# Patient Record
Sex: Male | Born: 1990 | Race: White | Hispanic: No | Marital: Married | Smoking: Former smoker
Health system: Southern US, Community
[De-identification: ages and names within clinical notes are randomized; demographics above are authoritative.]

## PROBLEM LIST (undated history)

## (undated) DIAGNOSIS — I10 Essential (primary) hypertension: Secondary | ICD-10-CM

---
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
## 2008-05-12 ENCOUNTER — Emergency Department (HOSPITAL_COMMUNITY): Admission: EM | Admit: 2008-05-12 | Discharge: 2008-05-12 | Payer: Self-pay | Admitting: Emergency Medicine

## 2010-06-10 LAB — RAPID STREP SCREEN (MED CTR MEBANE ONLY): Streptococcus, Group A Screen (Direct): NEGATIVE

## 2013-11-13 ENCOUNTER — Emergency Department (HOSPITAL_COMMUNITY): Payer: No Typology Code available for payment source

## 2013-11-13 ENCOUNTER — Encounter (HOSPITAL_COMMUNITY): Payer: Self-pay | Admitting: Emergency Medicine

## 2013-11-13 ENCOUNTER — Emergency Department (HOSPITAL_COMMUNITY)
Admission: EM | Admit: 2013-11-13 | Discharge: 2013-11-13 | Disposition: A | Discharge status: Home / Self Care | Payer: No Typology Code available for payment source | Attending: Emergency Medicine | Admitting: Emergency Medicine

## 2013-11-13 DIAGNOSIS — F17200 Nicotine dependence, unspecified, uncomplicated: Secondary | ICD-10-CM | POA: Insufficient documentation

## 2013-11-13 DIAGNOSIS — Y939 Activity, unspecified: Secondary | ICD-10-CM | POA: Insufficient documentation

## 2013-11-13 DIAGNOSIS — Y92410 Unspecified street and highway as the place of occurrence of the external cause: Secondary | ICD-10-CM | POA: Diagnosis not present

## 2013-11-13 DIAGNOSIS — IMO0002 Reserved for concepts with insufficient information to code with codable children: Secondary | ICD-10-CM | POA: Diagnosis present

## 2013-11-13 DIAGNOSIS — S139XXA Sprain of joints and ligaments of unspecified parts of neck, initial encounter: Secondary | ICD-10-CM | POA: Diagnosis not present

## 2013-11-13 DIAGNOSIS — S239XXA Sprain of unspecified parts of thorax, initial encounter: Secondary | ICD-10-CM | POA: Diagnosis not present

## 2013-11-13 DIAGNOSIS — T148XXA Other injury of unspecified body region, initial encounter: Secondary | ICD-10-CM

## 2013-11-13 MED ORDER — IBUPROFEN 800 MG PO TABS: 800.0000 mg | ORAL_TABLET | Freq: Three times a day (TID) | ORAL | Status: DC

## 2013-11-13 MED ORDER — OXYCODONE-ACETAMINOPHEN 5-325 MG PO TABS: 1.0000 | ORAL_TABLET | ORAL | Status: DC | PRN

## 2013-11-13 MED ORDER — CYCLOBENZAPRINE HCL 10 MG PO TABS: 10.0000 mg | ORAL_TABLET | Freq: Two times a day (BID) | ORAL | Status: DC | PRN

## 2013-11-13 MED ORDER — OXYCODONE-ACETAMINOPHEN 5-325 MG PO TABS
1.0000 | ORAL_TABLET | Freq: Once | ORAL | Status: AC
  Administered 2013-11-13: 1 via ORAL
  Filled 2013-11-13: qty 1
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
## 2013-11-13 NOTE — ED Provider Notes (Signed)
Medical screening examination/treatment/procedure(s) were performed by non-physician practitioner and as supervising physician I was immediately available for consultation/collaboration.   EKG Interpretation None       Nina Hoar, MD 11/13/13 1239 
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
## 2013-11-13 NOTE — ED Notes (Signed)
Patient transported to X-ray 

## 2013-11-21 ENCOUNTER — Encounter: Payer: Self-pay | Admitting: *Deleted

## 2014-01-13 ENCOUNTER — Ambulatory Visit: Payer: BC Managed Care – PPO | Admitting: Family Medicine
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
## 2017-05-08 ENCOUNTER — Emergency Department (HOSPITAL_COMMUNITY)
Admission: EM | Admit: 2017-05-08 | Discharge: 2017-05-08 | Disposition: A | Discharge status: Home / Self Care | Payer: 59 | Attending: Emergency Medicine | Admitting: Emergency Medicine

## 2017-05-08 ENCOUNTER — Encounter (HOSPITAL_COMMUNITY): Payer: Self-pay | Admitting: Emergency Medicine

## 2017-05-08 ENCOUNTER — Other Ambulatory Visit: Payer: Self-pay

## 2017-05-08 DIAGNOSIS — R55 Syncope and collapse: Secondary | ICD-10-CM | POA: Insufficient documentation

## 2017-05-08 DIAGNOSIS — Z5321 Procedure and treatment not carried out due to patient leaving prior to being seen by health care provider: Secondary | ICD-10-CM | POA: Diagnosis not present

## 2017-05-08 LAB — COMPREHENSIVE METABOLIC PANEL
ALT: 23 U/L (ref 17–63)
AST: 27 U/L (ref 15–41)
Albumin: 3.8 g/dL (ref 3.5–5.0)
Alkaline Phosphatase: 64 U/L (ref 38–126)
Anion gap: 8 (ref 5–15)
BUN: 14 mg/dL (ref 6–20)
CALCIUM: 9.2 mg/dL (ref 8.9–10.3)
CO2: 27 mmol/L (ref 22–32)
CREATININE: 0.98 mg/dL (ref 0.61–1.24)
Chloride: 105 mmol/L (ref 101–111)
GFR calc Af Amer: >60 mL/min (ref >60)
GFR calc non Af Amer: >60 mL/min (ref >60)
Glucose, Bld: 106 mg/dL — ABNORMAL HIGH (ref 65–99)
Potassium: 3.7 mmol/L (ref 3.5–5.1)
Sodium: 140 mmol/L (ref 135–145)
Total Bilirubin: 0.5 mg/dL (ref 0.3–1.2)
Total Protein: 7.2 g/dL (ref 6.5–8.1)

## 2017-05-08 LAB — CBC
HCT: 44.5 % (ref 39.0–52.0)
Hemoglobin: 14.5 g/dL (ref 13.0–17.0)
MCH: 28.2 pg (ref 26.0–34.0)
MCHC: 32.6 g/dL (ref 30.0–36.0)
MCV: 86.4 fL (ref 78.0–100.0)
Platelets: 245 10*3/uL (ref 150–400)
RBC: 5.15 MIL/uL (ref 4.22–5.81)
RDW: 13.4 % (ref 11.5–15.5)
WBC: 13.5 10*3/uL — ABNORMAL HIGH (ref 4.0–10.5)

## 2017-05-08 LAB — URINALYSIS, ROUTINE W REFLEX MICROSCOPIC
Bilirubin Urine: NEGATIVE
Glucose, UA: NEGATIVE mg/dL
Ketones, ur: NEGATIVE mg/dL
Leukocytes, UA: NEGATIVE
Nitrite: NEGATIVE
Protein, ur: 30 mg/dL — AB
Specific Gravity, Urine: 1.025 (ref 1.005–1.030)
pH: 6 (ref 5.0–8.0)

## 2017-05-08 LAB — RAPID URINE DRUG SCREEN, HOSP PERFORMED
Amphetamines: NOT DETECTED
BARBITURATES: NOT DETECTED
Benzodiazepines: NOT DETECTED
Cocaine: NOT DETECTED
Opiates: NOT DETECTED
Tetrahydrocannabinol: POSITIVE — AB

## 2017-05-08 NOTE — ED Notes (Signed)
Called no answer
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
## 2017-05-09 ENCOUNTER — Emergency Department (HOSPITAL_COMMUNITY): Payer: 59

## 2017-05-09 ENCOUNTER — Encounter (HOSPITAL_COMMUNITY): Payer: Self-pay | Admitting: Emergency Medicine

## 2017-05-09 ENCOUNTER — Emergency Department (HOSPITAL_COMMUNITY)
Admission: EM | Admit: 2017-05-09 | Discharge: 2017-05-09 | Disposition: A | Discharge status: Home / Self Care | Payer: 59 | Attending: Emergency Medicine | Admitting: Emergency Medicine

## 2017-05-09 DIAGNOSIS — W0110XA Fall on same level from slipping, tripping and stumbling with subsequent striking against unspecified object, initial encounter: Secondary | ICD-10-CM | POA: Insufficient documentation

## 2017-05-09 DIAGNOSIS — Y9389 Activity, other specified: Secondary | ICD-10-CM | POA: Diagnosis not present

## 2017-05-09 DIAGNOSIS — R69 Illness, unspecified: Secondary | ICD-10-CM

## 2017-05-09 DIAGNOSIS — Y999 Unspecified external cause status: Secondary | ICD-10-CM | POA: Diagnosis not present

## 2017-05-09 DIAGNOSIS — R55 Syncope and collapse: Secondary | ICD-10-CM

## 2017-05-09 DIAGNOSIS — S0990XA Unspecified injury of head, initial encounter: Secondary | ICD-10-CM

## 2017-05-09 DIAGNOSIS — J111 Influenza due to unidentified influenza virus with other respiratory manifestations: Secondary | ICD-10-CM

## 2017-05-09 DIAGNOSIS — Y92002 Bathroom of unspecified non-institutional (private) residence single-family (private) house as the place of occurrence of the external cause: Secondary | ICD-10-CM | POA: Insufficient documentation

## 2017-05-09 DIAGNOSIS — F17210 Nicotine dependence, cigarettes, uncomplicated: Secondary | ICD-10-CM | POA: Insufficient documentation

## 2017-05-09 DIAGNOSIS — I10 Essential (primary) hypertension: Secondary | ICD-10-CM | POA: Insufficient documentation

## 2017-05-09 LAB — CBC WITH DIFFERENTIAL/PLATELET
Basophils Absolute: 0 10*3/uL (ref 0.0–0.1)
Basophils Relative: 0 %
Eosinophils Absolute: 0.2 10*3/uL (ref 0.0–0.7)
Eosinophils Relative: 2 %
HCT: 41.6 % (ref 39.0–52.0)
Hemoglobin: 14 g/dL (ref 13.0–17.0)
Lymphocytes Relative: 13 %
Lymphs Abs: 0.9 10*3/uL (ref 0.7–4.0)
MCH: 28.8 pg (ref 26.0–34.0)
MCHC: 33.7 g/dL (ref 30.0–36.0)
MCV: 85.6 fL (ref 78.0–100.0)
Monocytes Absolute: 1.1 10*3/uL — ABNORMAL HIGH (ref 0.1–1.0)
Monocytes Relative: 15 %
Neutro Abs: 4.8 10*3/uL (ref 1.7–7.7)
Neutrophils Relative %: 70 %
Platelets: 218 10*3/uL (ref 150–400)
RBC: 4.86 MIL/uL (ref 4.22–5.81)
RDW: 13.4 % (ref 11.5–15.5)
WBC: 7 10*3/uL (ref 4.0–10.5)

## 2017-05-09 LAB — BASIC METABOLIC PANEL
Anion gap: 9 (ref 5–15)
BUN: 9 mg/dL (ref 6–20)
CO2: 26 mmol/L (ref 22–32)
Calcium: 8.8 mg/dL — ABNORMAL LOW (ref 8.9–10.3)
Chloride: 105 mmol/L (ref 101–111)
Creatinine, Ser: 1.08 mg/dL (ref 0.61–1.24)
GFR calc Af Amer: >60 mL/min (ref >60)
GFR calc non Af Amer: >60 mL/min (ref >60)
Glucose, Bld: 101 mg/dL — ABNORMAL HIGH (ref 65–99)
Potassium: 3.6 mmol/L (ref 3.5–5.1)
Sodium: 140 mmol/L (ref 135–145)

## 2017-05-09 MED ORDER — OSELTAMIVIR PHOSPHATE 75 MG PO CAPS: 75.0000 mg | ORAL_CAPSULE | Freq: Two times a day (BID) | ORAL | 0 refills | Status: DC
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
## 2017-05-09 NOTE — ED Notes (Signed)
Patient given discharge instructions and verbalized understanding.  Patient stable to discharge at this time.  Patient is alert and oriented to baseline.  No distressed noted at this time.  All belongings taken with the patient at discharge.   

## 2017-05-09 NOTE — Discharge Instructions (Signed)
Return to the ED with any concerns including recurrent fainting spells, chest pain, difficulty breathing, seizure activity, decreased level of alertness/lethargy, or any other alarming symptoms
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
## 2017-05-09 NOTE — ED Provider Notes (Signed)
[facility] EMERGENCY DEPARTMENT Provider Note   CSN: [id number] Arrival date & time: 05/09/17  1329     History   Chief Complaint Chief Complaint  Patient presents with  . Seizures    HPI Spencer Baker is a 27 y.o. male.  HPI  Patient presents after syncopal event yesterday.  He states he had a low-grade fever and had not been feeling well for the past couple of days.  He had just use the bathroom and was leaving the bathroom felt his heart racing and then fainted.  His wife witnessed the event states he went to open the bathroom door and fell backwards hitting his head and then falling to the ground.  He did lose consciousness.  Wife noted some shaking of his extremities briefly.  When he woke up he got immediately up.  He was not groggy or post ictal.  He denies any chest pain prior to fall, no severe headache.  Today he continues to have a low-grade fever with cough and congestion.  He did get his flu shot this year.  He has a sick contact with likely influenza.  There are no other associated systemic symptoms, there are no other alleviating or modifying factors.   History reviewed. No pertinent past medical history.  Patient Active Problem List   Diagnosis Date Noted  . OBESITY, NOS 04/27/2006  . ATTENTION DEFICIT, W/HYPERACTIVITY 04/27/2006  . HYPERTENSION, BENIGN SYSTEMIC 04/27/2006    History reviewed. No pertinent surgical history.     Home Medications    Prior to Admission medications   Medication Sig Start Date End Date Taking? Authorizing Provider  oseltamivir (TAMIFLU) 75 MG capsule Take 1 capsule (75 mg total) by mouth every 12 (twelve) hours. 05/09/17   Katherina Wimer, Latanya Maudlin, MD    Family History No family history on file.  Social History Social History   Tobacco Use  . Smoking status: Current Every Day Smoker    Packs/day: 0.50    Types: Cigarettes  . Smokeless tobacco: Never Used  Substance Use Topics  . Alcohol use: No  . Drug  use: No     Allergies   Patient has no known allergies.   Review of Systems Review of Systems  ROS reviewed and all otherwise negative except for mentioned in HPI   Physical Exam Updated Vital Signs BP (!) 160/102   Pulse 87   Temp (S) 99.7 F (37.6 C) (Oral)   Resp (!) 23   Ht 5\' 10"  (1.778 m)   Wt 124.7 kg (275 lb)   SpO2 98%   BMI 39.46 kg/m  Vitals reviewed Physical Exam  Physical Examination: General appearance - alert, well appearing, and in no distress Mental status - alert, oriented to person, place, and time Eyes - pupils equal and reactive, extraocular eye movements intact Mouth - mucous membranes moist, pharynx normal without lesions Neck - supple, no significant adenopathy, no midline neck pain Chest - clear to auscultation, no wheezes, rales or rhonchi, symmetric air entry Heart - normal rate, regular rhythm, normal S1, S2, no murmurs, rubs, clicks or gallops Abdomen - soft, nontender, nondistended, no masses or organomegaly Neurological - alert, oriented x 3, no cranial nerve deficit, strength 5/5 in extremities x 4, sensation intact, GCS 15 Extremities - peripheral pulses normal, no pedal edema, no clubbing or cyanosis Skin - normal coloration and turgor, no rashes   ED Treatments / Results  Labs (all labs ordered are listed, but only abnormal results are  displayed) Labs Reviewed  CBC WITH DIFFERENTIAL/PLATELET - Abnormal; Notable for the following components:      Result Value   Monocytes Absolute 1.1 (*)    All other components within normal limits  BASIC METABOLIC PANEL - Abnormal; Notable for the following components:   Glucose, Bld 101 (*)    Calcium 8.8 (*)    All other components within normal limits    EKG  EKG Interpretation  Date/Time:  Tuesday May 09 2017 14:32:05 EDT Ventricular Rate:  90 PR Interval:  136 QRS Duration: 96 QT Interval:  334 QTC Calculation: 408 R Axis:   72 Text Interpretation:  Normal sinus rhythm Normal  ECG No significant change since last tracing Confirmed by Jerelyn ScottLinker, Shacara Cozine [phone]([id number]) on 05/09/2017 6:09:26 PM       Radiology Ct Head Wo Contrast  Result Date: 05/09/2017 CLINICAL DATA:  Patient status post seizure yesterday. EXAM: CT HEAD WITHOUT CONTRAST TECHNIQUE: Contiguous axial images were obtained from the base of the skull through the vertex without intravenous contrast. COMPARISON:  None. FINDINGS: Brain: No evidence of acute infarction, hemorrhage, hydrocephalus, extra-axial collection or mass lesion/mass effect. Vascular: No hyperdense vessel or unexpected calcification. Skull: Intact. Sinuses/Orbits: Mucosal thickening is seen in the right sphenoid sinus. Other: None. IMPRESSION: Normal-appearing brain. Mucosal thickening right sphenoid sinus. Electronically Signed   By: Drusilla Kannerhomas  Dalessio M.D.   On: 05/09/2017 15:56    Procedures Procedures (including critical care time)  Medications Ordered in ED Medications - No data to display   Initial Impression / Assessment and Plan / ED Course  I have reviewed the triage vital signs and the nursing notes.  Pertinent labs & imaging results that were available during my care of the patient were reviewed by me and considered in my medical decision making (see chart for details).     Patient presenting after syncopal event yesterday during which he hit his head and had resultant brief shaking of his extremities.  Doubt this was a seizure as he was not postictal did not lose continence of bowel or bladder.  He did strike his head and head CT was obtained which was reassuring.  He also has fever cough body aches most consistent with influenza-like illness.  He is within the window for Tamiflu treatment for so prescription for Tamiflu was provided.  He has not anemic and EKG is reassuring.  Advised outpatient follow-up with primary care doctor.  Discharged with strict return precautions.  Pt agreeable with plan.  Final Clinical Impressions(s) / ED  Diagnoses   Final diagnoses:  Syncope, unspecified syncope type  Injury of head, initial encounter  Influenza-like illness    ED Discharge Orders        Ordered    oseltamivir (TAMIFLU) 75 MG capsule  Every 12 hours     05/09/17 1831       Phillis HaggisMabe, Ziona Wickens L, MD 05/09/17 1939

## 2017-05-09 NOTE — ED Notes (Signed)
MD at bedside. 

## 2018-09-12 ENCOUNTER — Other Ambulatory Visit: Payer: Self-pay

## 2018-09-12 ENCOUNTER — Encounter (HOSPITAL_COMMUNITY): Payer: Self-pay | Admitting: Emergency Medicine

## 2018-09-12 ENCOUNTER — Emergency Department (HOSPITAL_COMMUNITY)
Admission: EM | Admit: 2018-09-12 | Discharge: 2018-09-12 | Disposition: A | Discharge status: Home / Self Care | Payer: 59 | Attending: Emergency Medicine | Admitting: Emergency Medicine

## 2018-09-12 DIAGNOSIS — R197 Diarrhea, unspecified: Secondary | ICD-10-CM | POA: Insufficient documentation

## 2018-09-12 DIAGNOSIS — F17210 Nicotine dependence, cigarettes, uncomplicated: Secondary | ICD-10-CM | POA: Insufficient documentation

## 2018-09-12 DIAGNOSIS — R030 Elevated blood-pressure reading, without diagnosis of hypertension: Secondary | ICD-10-CM | POA: Diagnosis not present

## 2018-09-12 DIAGNOSIS — R112 Nausea with vomiting, unspecified: Secondary | ICD-10-CM | POA: Insufficient documentation

## 2018-09-12 LAB — COMPREHENSIVE METABOLIC PANEL
ALT: 32 U/L (ref 0–44)
AST: 23 U/L (ref 15–41)
Albumin: 4.2 g/dL (ref 3.5–5.0)
Alkaline Phosphatase: 63 U/L (ref 38–126)
Anion gap: 10 (ref 5–15)
BUN: 12 mg/dL (ref 6–20)
CO2: 27 mmol/L (ref 22–32)
Calcium: 9.5 mg/dL (ref 8.9–10.3)
Chloride: 104 mmol/L (ref 98–111)
Creatinine, Ser: 0.95 mg/dL (ref 0.61–1.24)
GFR calc Af Amer: >60 mL/min (ref >60)
GFR calc non Af Amer: >60 mL/min (ref >60)
Glucose, Bld: 110 mg/dL — ABNORMAL HIGH (ref 70–99)
Potassium: 4 mmol/L (ref 3.5–5.1)
Sodium: 141 mmol/L (ref 135–145)
Total Bilirubin: 0.5 mg/dL (ref 0.3–1.2)
Total Protein: 7.8 g/dL (ref 6.5–8.1)

## 2018-09-12 LAB — LIPASE, BLOOD: Lipase: 27 U/L (ref 11–51)

## 2018-09-12 LAB — CBC WITH DIFFERENTIAL/PLATELET
Abs Immature Granulocytes: 0.05 10*3/uL (ref 0.00–0.07)
Basophils Absolute: 0 10*3/uL (ref 0.0–0.1)
Basophils Relative: 0 %
Eosinophils Absolute: 0.2 10*3/uL (ref 0.0–0.5)
Eosinophils Relative: 1 %
HCT: 46.8 % (ref 39.0–52.0)
Hemoglobin: 15.3 g/dL (ref 13.0–17.0)
Immature Granulocytes: 0 %
Lymphocytes Relative: 8 %
Lymphs Abs: 1.2 10*3/uL (ref 0.7–4.0)
MCH: 28.5 pg (ref 26.0–34.0)
MCHC: 32.7 g/dL (ref 30.0–36.0)
MCV: 87.3 fL (ref 80.0–100.0)
Monocytes Absolute: 1.1 10*3/uL — ABNORMAL HIGH (ref 0.1–1.0)
Monocytes Relative: 8 %
Neutro Abs: 11.6 10*3/uL — ABNORMAL HIGH (ref 1.7–7.7)
Neutrophils Relative %: 83 %
Platelets: 283 10*3/uL (ref 150–400)
RBC: 5.36 MIL/uL (ref 4.22–5.81)
RDW: 13.3 % (ref 11.5–15.5)
WBC: 14.1 10*3/uL — ABNORMAL HIGH (ref 4.0–10.5)
nRBC: 0 % (ref 0.0–0.2)

## 2018-09-12 LAB — URINALYSIS, ROUTINE W REFLEX MICROSCOPIC
Bilirubin Urine: NEGATIVE
Glucose, UA: NEGATIVE mg/dL
Hgb urine dipstick: NEGATIVE
Ketones, ur: NEGATIVE mg/dL
Leukocytes,Ua: NEGATIVE
Nitrite: NEGATIVE
Protein, ur: NEGATIVE mg/dL
Specific Gravity, Urine: 1.028 (ref 1.005–1.030)
pH: 5 (ref 5.0–8.0)

## 2018-09-12 MED ORDER — DIPHENOXYLATE-ATROPINE 2.5-0.025 MG PO TABS
2.0000 | ORAL_TABLET | Freq: Once | ORAL | Status: AC
  Administered 2018-09-12: 2 via ORAL
  Filled 2018-09-12: qty 2

## 2018-09-12 MED ORDER — ONDANSETRON 4 MG PO TBDP
4.0000 mg | ORAL_TABLET | Freq: Once | ORAL | Status: AC
  Administered 2018-09-12: 4 mg via ORAL
  Filled 2018-09-12: qty 1

## 2018-09-12 MED ORDER — DIPHENOXYLATE-ATROPINE 2.5-0.025 MG PO TABS: 1.0000 | ORAL_TABLET | Freq: Four times a day (QID) | ORAL | 0 refills | Status: DC | PRN

## 2018-09-12 MED ORDER — ONDANSETRON 4 MG PO TBDP: 4.0000 mg | ORAL_TABLET | Freq: Three times a day (TID) | ORAL | 0 refills | Status: DC | PRN

## 2018-09-12 NOTE — ED Triage Notes (Signed)
C/o n/v/d since this am about 3 am.  Pt reports having lunch from work provides.  Lunch was [place] club.  Reports having about 10 loose and watery stools since 10 am.  Vomited about 4 times in last 24 hours.

## 2018-09-12 NOTE — ED Provider Notes (Signed)
[facility] Provider Note   CSN: [id number] Arrival date & time: 09/12/18  4098    History   Chief Complaint Chief Complaint  Patient presents with  . Diarrhea  . Emesis    HPI Spencer Baker is a 28 y.o. male with a history of obesity, attention deficit disorder and hypertension per his record, but patient denies history of this presenting with nausea vomiting and diarrhea which woke him around 3 AM today.  He reports eating a [place] sandwich yesterday around noon, meal was provided by his employer and other employees ate the same food as he, but about an hour after eating he developed some mild queasiness but had no other symptoms until he woke at 3 AM.  He reports 3-4 episodes of nonbloody emesis which has improved, but has had 10 or more episodes of diarrhea, now with passage of just clear water without blood.  He has had no fevers or chills, denies abdominal pain, simply reports cramping with improvement after a bowel movement.  He denies weakness or dizziness, also no chest pain, shortness of breath, headache.  He had a dose of Pepto-Bismol earlier this morning but he did not retain this medication.  No known exposures to others with similar symptoms.  No recent travel or antibiotic use.     The history is provided by the patient.    History reviewed. No pertinent past medical history.  Patient Active Problem List   Diagnosis Date Noted  . OBESITY, NOS 04/27/2006  . ATTENTION DEFICIT, W/HYPERACTIVITY 04/27/2006  . HYPERTENSION, BENIGN SYSTEMIC 04/27/2006    History reviewed. No pertinent surgical history.      Home Medications    Prior to Admission medications   Medication Sig Start Date End Date Taking? Authorizing Provider  diphenoxylate-atropine (LOMOTIL) 2.5-0.025 MG tablet Take 1 tablet by mouth 4 (four) times daily as needed for diarrhea or loose stools. 09/12/18   Evalee Jefferson, PA-C  ondansetron (ZOFRAN ODT) 4 MG disintegrating tablet  Take 1 tablet (4 mg total) by mouth every 8 (eight) hours as needed for nausea or vomiting. 09/12/18   Lamyiah Crawshaw, Almyra Free, PA-C  oseltamivir (TAMIFLU) 75 MG capsule Take 1 capsule (75 mg total) by mouth every 12 (twelve) hours. Patient not taking: Reported on 09/12/2018 05/09/17   Mabe, Forbes Cellar, MD    Family History History reviewed. No pertinent family history.  Social History Social History   Tobacco Use  . Smoking status: Current Every Day Smoker    Packs/day: 0.50    Types: Cigarettes  . Smokeless tobacco: Never Used  Substance Use Topics  . Alcohol use: No  . Drug use: No     Allergies   Patient has no known allergies.   Review of Systems Review of Systems  Constitutional: Negative for chills and fever.  HENT: Negative for congestion and sore throat.   Eyes: Negative.   Respiratory: Negative for chest tightness and shortness of breath.   Cardiovascular: Negative for chest pain.  Gastrointestinal: Positive for diarrhea, nausea and vomiting. Negative for abdominal pain.  Genitourinary: Negative.  Negative for difficulty urinating.  Musculoskeletal: Negative for arthralgias, joint swelling and neck pain.  Skin: Negative.  Negative for rash and wound.  Neurological: Negative for dizziness, weakness, light-headedness, numbness and headaches.  Psychiatric/Behavioral: Negative.      Physical Exam Updated Vital Signs BP (!) 157/107 (BP Location: Right Arm)   Pulse 65   Temp 98.2 F (36.8 C) (Oral)   Resp 18  Ht 5\' 10"  (1.778 m)   Wt 127 kg   SpO2 100%   BMI 40.18 kg/m   Physical Exam Vitals signs and nursing note reviewed.  Constitutional:      Appearance: He is well-developed. He is obese.  HENT:     Head: Normocephalic and atraumatic.     Mouth/Throat:     Mouth: Mucous membranes are moist.     Pharynx: Oropharynx is clear.  Neck:     Musculoskeletal: Normal range of motion.  Cardiovascular:     Rate and Rhythm: Normal rate and regular rhythm.     Heart  sounds: Normal heart sounds.     Comments: Hypertensive. Pulmonary:     Effort: Pulmonary effort is normal.     Breath sounds: Normal breath sounds. No wheezing.  Abdominal:     General: Bowel sounds are normal. There is no distension.     Palpations: Abdomen is soft.     Tenderness: There is no abdominal tenderness. There is no guarding or rebound.  Musculoskeletal: Normal range of motion.  Skin:    General: Skin is warm and dry.  Neurological:     Mental Status: He is alert.      ED Treatments / Results  Labs (all labs ordered are listed, but only abnormal results are displayed) Labs Reviewed  CBC WITH DIFFERENTIAL/PLATELET - Abnormal; Notable for the following components:      Result Value   WBC 14.1 (*)    Neutro Abs 11.6 (*)    Monocytes Absolute 1.1 (*)    All other components within normal limits  COMPREHENSIVE METABOLIC PANEL - Abnormal; Notable for the following components:   Glucose, Bld 110 (*)    All other components within normal limits  URINALYSIS, ROUTINE W REFLEX MICROSCOPIC - Abnormal; Notable for the following components:   APPearance HAZY (*)    All other components within normal limits  LIPASE, BLOOD    EKG None  Radiology No results found.  Procedures Procedures (including critical care time)  Medications Ordered in ED Medications  ondansetron (ZOFRAN-ODT) disintegrating tablet 4 mg (4 mg Oral Given 09/12/18 1026)  diphenoxylate-atropine (LOMOTIL) 2.5-0.025 MG per tablet 2 tablet (2 tablets Oral Given 09/12/18 1149)     Initial Impression / Assessment and Plan / ED Course  I have reviewed the triage vital signs and the nursing notes.  Pertinent labs & imaging results that were available during my care of the patient were reviewed by me and considered in my medical decision making (see chart for details).        Patient with a probable viral gastroenteritis.  He had no symptoms while here, he was given IV fluids, Zofran and Lomotil.  He  tolerated p.o. fluids prior to discharge home.  Home instructions given, return precautions discussed.  Discussed blood pressure elevation and need for recheck.  He has no symptoms of endorgan damage, denies headache, vision changes, chest pain or shortness of breath.  Final Clinical Impressions(s) / ED Diagnoses   Final diagnoses:  Nausea vomiting and diarrhea  Elevated blood pressure reading    ED Discharge Orders         Ordered    ondansetron (ZOFRAN ODT) 4 MG disintegrating tablet  Every 8 hours PRN     09/12/18 1155    diphenoxylate-atropine (LOMOTIL) 2.5-0.025 MG tablet  4 times daily PRN     09/12/18 1155           IdolRaynelle Fanning, Heather Mckendree, PA-C 09/12/18 1200  Bethann BerkshireZammit, Joseph, MD 09/13/18 0800

## 2018-09-12 NOTE — Discharge Instructions (Signed)
Rest make sure you are drinking plenty of fluids.  You may use the medications as prescribed if your symptoms persist.  You may benefit by a bland, high carbohydrate diet for the next day or until your symptoms improve.  As discussed the brat diet is especially useful-this stands for bananas, rice, applesauce and dry toast.  Get rechecked for any worsening or persistent symptoms.  Only take additional Lomotil if your diarrhea persists, taking this medication if you are not continuing to have diarrhea can make you constipated.  Your blood pressure has remained elevated during your visit here.  I encourage you to get an office visit with your primary doctor for recheck and further management if your blood pressure continues to be elevated.

## 2018-09-12 NOTE — ED Notes (Signed)
Patient given sprite to drink as fluid challenge.

## 2018-12-25 ENCOUNTER — Other Ambulatory Visit: Payer: Self-pay | Admitting: *Deleted

## 2018-12-25 DIAGNOSIS — Z20822 Contact with and (suspected) exposure to covid-19: Secondary | ICD-10-CM

## 2018-12-27 LAB — NOVEL CORONAVIRUS, NAA: SARS-CoV-2, NAA: NOT DETECTED

## 2019-04-02 ENCOUNTER — Ambulatory Visit: Payer: 59 | Attending: Internal Medicine

## 2019-04-02 ENCOUNTER — Other Ambulatory Visit: Payer: Self-pay

## 2019-04-02 DIAGNOSIS — Z20822 Contact with and (suspected) exposure to covid-19: Secondary | ICD-10-CM

## 2019-04-03 LAB — NOVEL CORONAVIRUS, NAA: SARS-CoV-2, NAA: NOT DETECTED

## 2019-05-21 ENCOUNTER — Ambulatory Visit: Payer: 59 | Attending: Internal Medicine

## 2019-05-21 ENCOUNTER — Other Ambulatory Visit: Payer: Self-pay

## 2019-05-21 DIAGNOSIS — Z20822 Contact with and (suspected) exposure to covid-19: Secondary | ICD-10-CM

## 2019-05-22 LAB — SARS-COV-2, NAA 2 DAY TAT

## 2019-05-22 LAB — NOVEL CORONAVIRUS, NAA: SARS-CoV-2, NAA: NOT DETECTED

## 2019-06-06 ENCOUNTER — Other Ambulatory Visit: Payer: Self-pay

## 2019-06-06 ENCOUNTER — Encounter (HOSPITAL_COMMUNITY): Payer: Self-pay | Admitting: *Deleted

## 2019-06-06 ENCOUNTER — Inpatient Hospital Stay (HOSPITAL_COMMUNITY)
Admission: EM | Admit: 2019-06-06 | Discharge: 2019-06-11 | DRG: 872 | Disposition: A | Discharge status: Home / Self Care | Payer: 59 | Attending: Internal Medicine | Admitting: Internal Medicine

## 2019-06-06 ENCOUNTER — Emergency Department (HOSPITAL_COMMUNITY): Payer: 59

## 2019-06-06 DIAGNOSIS — Z6841 Body Mass Index (BMI) 40.0 and over, adult: Secondary | ICD-10-CM

## 2019-06-06 DIAGNOSIS — L0390 Cellulitis, unspecified: Secondary | ICD-10-CM | POA: Diagnosis not present

## 2019-06-06 DIAGNOSIS — F17210 Nicotine dependence, cigarettes, uncomplicated: Secondary | ICD-10-CM | POA: Diagnosis present

## 2019-06-06 DIAGNOSIS — A419 Sepsis, unspecified organism: Secondary | ICD-10-CM | POA: Diagnosis present

## 2019-06-06 DIAGNOSIS — E876 Hypokalemia: Secondary | ICD-10-CM | POA: Diagnosis present

## 2019-06-06 DIAGNOSIS — L03818 Cellulitis of other sites: Secondary | ICD-10-CM

## 2019-06-06 DIAGNOSIS — L03314 Cellulitis of groin: Secondary | ICD-10-CM | POA: Diagnosis present

## 2019-06-06 DIAGNOSIS — Z20822 Contact with and (suspected) exposure to covid-19: Secondary | ICD-10-CM | POA: Diagnosis present

## 2019-06-06 DIAGNOSIS — L732 Hidradenitis suppurativa: Secondary | ICD-10-CM

## 2019-06-06 DIAGNOSIS — E669 Obesity, unspecified: Secondary | ICD-10-CM | POA: Diagnosis present

## 2019-06-06 DIAGNOSIS — I10 Essential (primary) hypertension: Secondary | ICD-10-CM | POA: Diagnosis present

## 2019-06-06 HISTORY — DX: Essential (primary) hypertension: I10

## 2019-06-06 LAB — CBC WITH DIFFERENTIAL/PLATELET
Abs Immature Granulocytes: 0.08 10*3/uL — ABNORMAL HIGH (ref 0.00–0.07)
Basophils Absolute: 0 10*3/uL (ref 0.0–0.1)
Basophils Relative: 0 %
Eosinophils Absolute: 0.1 10*3/uL (ref 0.0–0.5)
Eosinophils Relative: 1 %
HCT: 42.9 % (ref 39.0–52.0)
Hemoglobin: 14.3 g/dL (ref 13.0–17.0)
Immature Granulocytes: 0 %
Lymphocytes Relative: 10 %
Lymphs Abs: 2 10*3/uL (ref 0.7–4.0)
MCH: 28.5 pg (ref 26.0–34.0)
MCHC: 33.3 g/dL (ref 30.0–36.0)
MCV: 85.6 fL (ref 80.0–100.0)
Monocytes Absolute: 2.3 10*3/uL — ABNORMAL HIGH (ref 0.1–1.0)
Monocytes Relative: 12 %
Neutro Abs: 14.7 10*3/uL — ABNORMAL HIGH (ref 1.7–7.7)
Neutrophils Relative %: 77 %
Platelets: 238 10*3/uL (ref 150–400)
RBC: 5.01 MIL/uL (ref 4.22–5.81)
RDW: 13.2 % (ref 11.5–15.5)
WBC: 19.2 10*3/uL — ABNORMAL HIGH (ref 4.0–10.5)
nRBC: 0 % (ref 0.0–0.2)

## 2019-06-06 LAB — BASIC METABOLIC PANEL
Anion gap: 9 (ref 5–15)
BUN: 17 mg/dL (ref 6–20)
CO2: 23 mmol/L (ref 22–32)
Calcium: 8.8 mg/dL — ABNORMAL LOW (ref 8.9–10.3)
Chloride: 103 mmol/L (ref 98–111)
Creatinine, Ser: 1.11 mg/dL (ref 0.61–1.24)
GFR calc Af Amer: >60 mL/min (ref >60)
GFR calc non Af Amer: >60 mL/min (ref >60)
Glucose, Bld: 118 mg/dL — ABNORMAL HIGH (ref 70–99)
Potassium: 2.9 mmol/L — ABNORMAL LOW (ref 3.5–5.1)
Sodium: 135 mmol/L (ref 135–145)

## 2019-06-06 LAB — LACTIC ACID, PLASMA: Lactic Acid, Venous: 0.7 mmol/L (ref 0.5–1.9)

## 2019-06-06 MED ORDER — VANCOMYCIN HCL IN DEXTROSE 1-5 GM/200ML-% IV SOLN: 1000.0000 mg | Freq: Once | INTRAVENOUS | Status: DC

## 2019-06-06 MED ORDER — ACETAMINOPHEN 500 MG PO TABS
1000.0000 mg | ORAL_TABLET | Freq: Once | ORAL | Status: AC
  Administered 2019-06-06: 1000 mg via ORAL
  Filled 2019-06-06: qty 2

## 2019-06-06 MED ORDER — SODIUM CHLORIDE 0.9 % IV SOLN
2.0000 g | Freq: Once | INTRAVENOUS | Status: AC
  Administered 2019-06-06: 2 g via INTRAVENOUS
  Filled 2019-06-06: qty 20

## 2019-06-06 MED ORDER — SODIUM CHLORIDE 0.9 % IV BOLUS
1000.0000 mL | Freq: Once | INTRAVENOUS | Status: AC
  Administered 2019-06-06: 1000 mL via INTRAVENOUS

## 2019-06-06 NOTE — ED Provider Notes (Signed)
[facility] EMERGENCY DEPARTMENT Provider Note   CSN: [id number] Arrival date & time: 06/06/19  2122     History Chief Complaint  Patient presents with  . Abscess    fever 103.2 in triage    Spencer Baker is a 29 y.o. male.  Patient with history of obesity presenting with "boil" to his right groin which he noticed about 3 days ago.  States it started as an ingrown hair which his wife pulled out and that he tried to squeeze the area.  He had increasing pain and swelling over the past 2 or 3 days.  He was febrile tachycardic on arrival.  He states he is feeling better since he arrived.  He has had pain across his low back in his pubic area.  No pain with urination or blood in the urine.  No abdominal pain, vomiting or diarrhea.  Did not know he had a fever until he got here.  States he is not a diabetic.  He has had abscesses in the past but never required any intervention.  The history is provided by the patient.  Abscess Associated symptoms: fever and headaches   Associated symptoms: no nausea and no vomiting        History reviewed. No pertinent past medical history.  Patient Active Problem List   Diagnosis Date Noted  . OBESITY, NOS 04/27/2006  . ATTENTION DEFICIT, W/HYPERACTIVITY 04/27/2006  . HYPERTENSION, BENIGN SYSTEMIC 04/27/2006    History reviewed. No pertinent surgical history.     History reviewed. No pertinent family history.  Social History   Tobacco Use  . Smoking status: Current Every Day Smoker    Packs/day: 0.50    Types: Cigarettes  . Smokeless tobacco: Never Used  Substance Use Topics  . Alcohol use: No  . Drug use: No    Home Medications Prior to Admission medications   Not on File    Allergies    Patient has no known allergies.  Review of Systems   Review of Systems  Constitutional: Positive for activity change, appetite change and fever.  HENT: Negative for congestion.   Eyes: Negative for visual disturbance.  Respiratory:  Negative for cough, chest tightness and shortness of breath.   Cardiovascular: Negative for chest pain.  Gastrointestinal: Negative for abdominal pain, nausea and vomiting.  Genitourinary: Negative for dysuria and hematuria.  Musculoskeletal: Negative for arthralgias and myalgias.  Skin: Positive for wound.  Neurological: Positive for weakness and headaches. Negative for dizziness.   all other systems are negative except as noted in the HPI and PMH.    Physical Exam Updated Vital Signs BP (!) 156/71   Pulse 94   Temp (!) 100.7 F (38.2 C) (Oral)   Resp (!) 30   Ht 5\' 10"  (1.778 m)   Wt 136.1 kg   SpO2 98%   BMI 43.05 kg/m   Physical Exam Vitals and nursing note reviewed.  Constitutional:      General: He is not in acute distress.    Appearance: He is well-developed. He is obese. He is ill-appearing.  HENT:     Head: Normocephalic and atraumatic.     Mouth/Throat:     Pharynx: No oropharyngeal exudate.  Eyes:     Conjunctiva/sclera: Conjunctivae normal.     Pupils: Pupils are equal, round, and reactive to light.  Neck:     Comments: No meningismus. Cardiovascular:     Rate and Rhythm: Normal rate and regular rhythm.     Heart sounds:  Normal heart sounds. No murmur.  Pulmonary:     Effort: Pulmonary effort is normal. No respiratory distress.     Breath sounds: Normal breath sounds.  Abdominal:     Palpations: Abdomen is soft.     Tenderness: There is no abdominal tenderness. There is no guarding or rebound.  Genitourinary:    Comments: Extensive induration and erythema across mons pubis.  There is a punctate area that is draining purulent material on the right side. More induration than fluctuance. Erythema does not extend to thighs or scrotum. No crepitus Musculoskeletal:        General: No tenderness. Normal range of motion.     Cervical back: Normal range of motion and neck supple.  Skin:    General: Skin is warm.  Neurological:     Mental Status: He is alert  and oriented to person, place, and time.     Cranial Nerves: No cranial nerve deficit.     Motor: No abnormal muscle tone.     Coordination: Coordination normal.     Comments: No ataxia on finger to nose bilaterally. No pronator drift. 5/5 strength throughout. CN 2-12 intact.Equal grip strength. Sensation intact.   Psychiatric:        Behavior: Behavior normal.     ED Results / Procedures / Treatments   Labs (all labs ordered are listed, but only abnormal results are displayed) Labs Reviewed  CBC WITH DIFFERENTIAL/PLATELET - Abnormal; Notable for the following components:      Result Value   WBC 19.2 (*)    Neutro Abs 14.7 (*)    Monocytes Absolute 2.3 (*)    Abs Immature Granulocytes 0.08 (*)    All other components within normal limits  BASIC METABOLIC PANEL - Abnormal; Notable for the following components:   Potassium 2.9 (*)    Glucose, Bld 118 (*)    Calcium 8.8 (*)    All other components within normal limits  URINALYSIS, ROUTINE W REFLEX MICROSCOPIC - Abnormal; Notable for the following components:   Specific Gravity, Urine 1.032 (*)    Hgb urine dipstick LARGE (*)    All other components within normal limits  RESPIRATORY PANEL BY RT PCR (FLU A&B, COVID)  CULTURE, BLOOD (ROUTINE X 2)  CULTURE, BLOOD (ROUTINE X 2)  URINE CULTURE  LACTIC ACID, PLASMA  LACTIC ACID, PLASMA  APTT  PROTIME-INR    EKG EKG Interpretation  Date/Time:  Thursday June 06 2019 22:30:34 EDT Ventricular Rate:  108 PR Interval:    QRS Duration: 111 QT Interval:  333 QTC Calculation: 447 R Axis:   51 Text Interpretation: Sinus tachycardia No significant change since last tracing Confirmed by [facility]  MD, CHARLES [phone]) on 06/06/2019 10:39:55 PM   Radiology CT PELVIS W CONTRAST  Result Date: 06/07/2019 CLINICAL DATA:  Right groin abscess. EXAM: CT PELVIS WITH CONTRAST TECHNIQUE: Multidetector CT imaging of the pelvis was performed using the standard protocol following the bolus  administration of intravenous contrast. CONTRAST:  OMNIPAQUE IOHEXOL 300 MG/ML  SOLN COMPARISON:  None. FINDINGS: Urinary Tract:  No abnormality visualized. Bowel:  Unremarkable visualized pelvic bowel loops. Vascular/Lymphatic: No pathologically enlarged lymph nodes. No significant vascular abnormality seen. Reproductive:  No mass or other significant abnormality Other: Marked severity anterior, diffuse subcutaneous inflammatory fat stranding is seen along the midline of the lower pelvic wall. This is most prominent within the region above the penile shaft, right greater than left. Mild extension along the anterior aspect of the right groin is  seen. There is mild associated cutaneous thickening. No associated fluid collection or abscess is identified. Mild bilateral inguinal lymphadenopathy is seen. Musculoskeletal: No suspicious bone lesions identified. IMPRESSION: 1. Marked severity cellulitis along the anterior aspect of the lower pelvic wall, within the region above the penile shaft. 2. No associated fluid collection or abscess. Electronically Signed   By: Virgina Norfolk M.D.   On: 06/07/2019 01:00    Procedures .Critical Care Performed by: Ezequiel Essex, MD Authorized by: Ezequiel Essex, MD   Critical care provider statement:    Critical care time (minutes):  35   Critical care was necessary to treat or prevent imminent or life-threatening deterioration of the following conditions:  Sepsis   Critical care was time spent personally by me on the following activities:  Discussions with consultants, evaluation of patient's response to treatment, examination of patient, ordering and performing treatments and interventions, ordering and review of laboratory studies, ordering and review of radiographic studies, pulse oximetry, re-evaluation of patient's condition, obtaining history from patient or surrogate and review of old charts   (including critical care time)  Medications Ordered in  ED Medications  vancomycin (VANCOCIN) IVPB 1000 mg/200 mL premix (has no administration in time range)  cefTRIAXone (ROCEPHIN) 2 g in sodium chloride 0.9 % 100 mL IVPB (has no administration in time range)  sodium chloride 0.9 % bolus 1,000 mL (has no administration in time range)  acetaminophen (TYLENOL) tablet 1,000 mg (1,000 mg Oral Given 06/06/19 2135)    ED Course  I have reviewed the triage vital signs and the nursing notes.  Pertinent labs & imaging results that were available during my care of the patient were reviewed by me and considered in my medical decision making (see chart for details).    MDM Rules/Calculators/A&P                     Patient here with pubic abscess.  He is febrile and tachycardic on arrival.  Code sepsis activated.  He is given IV fluids and broad-spectrum antibiotics after cultures obtained  CT scan will be obtained to evaluate the extent of the abscess.  Small area of drainage on exam but no crepitus.  Labs show leukocytosis and hypokalemia which was repleted.  CT scan shows severe cellulitis without evidence of fluid collection or drainable abscess.  Patient meets sepsis criteria with fever, tachycardia and leukocytosis.  He is given IV fluids and IV antibiotics. We will plan observation admission and possible surgical consultation in the morning to evaluate for Drainable abscess though no fluid collection seen on CT today.  Patient agreeable. D/w Dr. Humphrey Rolls.  HILARY PUNDT was evaluated in Emergency Department on 06/07/2019 for the symptoms described in the history of present illness. He was evaluated in the context of the global COVID-19 pandemic, which necessitated consideration that the patient might be at risk for infection with the SARS-CoV-2 virus that causes COVID-19. Institutional protocols and algorithms that pertain to the evaluation of patients at risk for COVID-19 are in a state of rapid change based on information released by regulatory  bodies including the CDC and federal and state organizations. These policies and algorithms were followed during the patient's care in the ED.  Final Clinical Impression(s) / ED Diagnoses Final diagnoses:  Sepsis with acute organ dysfunction without septic shock, due to unspecified organism, unspecified type ([place])  Cellulitis of other specified site    Rx / DC Orders ED Discharge Orders    None  Glynn Octave, MD 06/07/19 [phone]

## 2019-06-06 NOTE — ED Triage Notes (Signed)
Pt with an abscess to right groin, ingrown hair and pt admits to squeezing to area.  Noted at first 3 days ago and got worse yesterday.  Pt states of feeling bad today.  Fever 103.2 noted in triage.

## 2019-06-07 DIAGNOSIS — L03314 Cellulitis of groin: Secondary | ICD-10-CM | POA: Diagnosis present

## 2019-06-07 DIAGNOSIS — L732 Hidradenitis suppurativa: Secondary | ICD-10-CM | POA: Diagnosis present

## 2019-06-07 DIAGNOSIS — L0390 Cellulitis, unspecified: Secondary | ICD-10-CM

## 2019-06-07 DIAGNOSIS — F17210 Nicotine dependence, cigarettes, uncomplicated: Secondary | ICD-10-CM | POA: Diagnosis present

## 2019-06-07 DIAGNOSIS — A419 Sepsis, unspecified organism: Secondary | ICD-10-CM | POA: Diagnosis present

## 2019-06-07 DIAGNOSIS — E876 Hypokalemia: Secondary | ICD-10-CM

## 2019-06-07 DIAGNOSIS — Z6841 Body Mass Index (BMI) 40.0 and over, adult: Secondary | ICD-10-CM

## 2019-06-07 DIAGNOSIS — Z20822 Contact with and (suspected) exposure to covid-19: Secondary | ICD-10-CM | POA: Diagnosis present

## 2019-06-07 DIAGNOSIS — I10 Essential (primary) hypertension: Secondary | ICD-10-CM | POA: Diagnosis present

## 2019-06-07 DIAGNOSIS — L03818 Cellulitis of other sites: Secondary | ICD-10-CM | POA: Diagnosis not present

## 2019-06-07 LAB — CBC
HCT: 40.7 % (ref 39.0–52.0)
Hemoglobin: 13.3 g/dL (ref 13.0–17.0)
MCH: 28.9 pg (ref 26.0–34.0)
MCHC: 32.7 g/dL (ref 30.0–36.0)
MCV: 88.5 fL (ref 80.0–100.0)
Platelets: 208 10*3/uL (ref 150–400)
RBC: 4.6 MIL/uL (ref 4.22–5.81)
RDW: 13.5 % (ref 11.5–15.5)
WBC: 17.9 10*3/uL — ABNORMAL HIGH (ref 4.0–10.5)
nRBC: 0 % (ref 0.0–0.2)

## 2019-06-07 LAB — URINALYSIS, ROUTINE W REFLEX MICROSCOPIC
Bacteria, UA: NONE SEEN
Bilirubin Urine: NEGATIVE
Glucose, UA: NEGATIVE mg/dL
Ketones, ur: NEGATIVE mg/dL
Leukocytes,Ua: NEGATIVE
Nitrite: NEGATIVE
Protein, ur: NEGATIVE mg/dL
Specific Gravity, Urine: 1.032 — ABNORMAL HIGH (ref 1.005–1.030)
pH: 5 (ref 5.0–8.0)

## 2019-06-07 LAB — RESPIRATORY PANEL BY RT PCR (FLU A&B, COVID)
Influenza A by PCR: NEGATIVE
Influenza B by PCR: NEGATIVE
SARS Coronavirus 2 by RT PCR: NEGATIVE

## 2019-06-07 LAB — PROTIME-INR
INR: 1 (ref 0.8–1.2)
Prothrombin Time: 13.6 seconds (ref 11.4–15.2)

## 2019-06-07 LAB — MAGNESIUM: Magnesium: 1.9 mg/dL (ref 1.7–2.4)

## 2019-06-07 LAB — APTT: aPTT: 32 seconds (ref 24–36)

## 2019-06-07 LAB — LACTIC ACID, PLASMA: Lactic Acid, Venous: 0.9 mmol/L (ref 0.5–1.9)

## 2019-06-07 LAB — CREATININE, SERUM
Creatinine, Ser: 0.98 mg/dL (ref 0.61–1.24)
GFR calc Af Amer: >60 mL/min (ref >60)
GFR calc non Af Amer: >60 mL/min (ref >60)

## 2019-06-07 LAB — HIV ANTIBODY (ROUTINE TESTING W REFLEX): HIV Screen 4th Generation wRfx: NONREACTIVE

## 2019-06-07 MED ORDER — HYDROCODONE-ACETAMINOPHEN 5-325 MG PO TABS
1.0000 | ORAL_TABLET | ORAL | Status: DC | PRN
  Administered 2019-06-08 (×2): 1 via ORAL
  Filled 2019-06-07: qty 1
  Filled 2019-06-07: qty 2

## 2019-06-07 MED ORDER — SENNOSIDES-DOCUSATE SODIUM 8.6-50 MG PO TABS
1.0000 | ORAL_TABLET | Freq: Every evening | ORAL | Status: DC | PRN
  Filled 2019-06-07: qty 1

## 2019-06-07 MED ORDER — ONDANSETRON HCL 4 MG/2ML IJ SOLN: 4.0000 mg | Freq: Four times a day (QID) | INTRAMUSCULAR | Status: DC | PRN

## 2019-06-07 MED ORDER — ACETAMINOPHEN 325 MG PO TABS
650.0000 mg | ORAL_TABLET | Freq: Four times a day (QID) | ORAL | Status: DC | PRN
  Administered 2019-06-07 – 2019-06-08 (×4): 650 mg via ORAL
  Filled 2019-06-07 (×4): qty 2

## 2019-06-07 MED ORDER — IOHEXOL 300 MG/ML  SOLN
100.0000 mL | Freq: Once | INTRAMUSCULAR | Status: AC | PRN
  Administered 2019-06-07: 100 mL via INTRAVENOUS

## 2019-06-07 MED ORDER — SODIUM CHLORIDE 0.9 % IV SOLN: 2.0000 g | INTRAVENOUS | Status: DC

## 2019-06-07 MED ORDER — ENOXAPARIN SODIUM 80 MG/0.8ML ~~LOC~~ SOLN
70.0000 mg | SUBCUTANEOUS | Status: DC
  Administered 2019-06-07 – 2019-06-09 (×3): 70 mg via SUBCUTANEOUS
  Filled 2019-06-07 (×4): qty 0.8

## 2019-06-07 MED ORDER — POTASSIUM CHLORIDE CRYS ER 20 MEQ PO TBCR
40.0000 meq | EXTENDED_RELEASE_TABLET | ORAL | Status: AC
  Administered 2019-06-07 (×2): 40 meq via ORAL
  Filled 2019-06-07 (×2): qty 2

## 2019-06-07 MED ORDER — VANCOMYCIN HCL 1500 MG/300ML IV SOLN
1500.0000 mg | Freq: Two times a day (BID) | INTRAVENOUS | Status: DC
  Administered 2019-06-07 – 2019-06-11 (×8): 1500 mg via INTRAVENOUS
  Filled 2019-06-07 (×8): qty 300

## 2019-06-07 MED ORDER — SODIUM CHLORIDE 0.9 % IV SOLN
2.0000 g | Freq: Three times a day (TID) | INTRAVENOUS | Status: DC
  Administered 2019-06-07 – 2019-06-11 (×12): 2 g via INTRAVENOUS
  Filled 2019-06-07 (×11): qty 2

## 2019-06-07 MED ORDER — POTASSIUM CHLORIDE 10 MEQ/100ML IV SOLN
10.0000 meq | INTRAVENOUS | Status: AC
  Administered 2019-06-07 (×2): 10 meq via INTRAVENOUS
  Filled 2019-06-07 (×2): qty 100

## 2019-06-07 MED ORDER — SODIUM CHLORIDE 0.9 % IV SOLN: INTRAVENOUS | Status: DC

## 2019-06-07 MED ORDER — ENOXAPARIN SODIUM 40 MG/0.4ML ~~LOC~~ SOLN
40.0000 mg | SUBCUTANEOUS | Status: DC
  Filled 2019-06-07: qty 0.4

## 2019-06-07 MED ORDER — VANCOMYCIN HCL IN DEXTROSE 1-5 GM/200ML-% IV SOLN: 1000.0000 mg | Freq: Once | INTRAVENOUS | Status: DC

## 2019-06-07 MED ORDER — ACETAMINOPHEN 650 MG RE SUPP: 650.0000 mg | Freq: Four times a day (QID) | RECTAL | Status: DC | PRN

## 2019-06-07 MED ORDER — ONDANSETRON HCL 4 MG PO TABS: 4.0000 mg | ORAL_TABLET | Freq: Four times a day (QID) | ORAL | Status: DC | PRN

## 2019-06-07 MED ORDER — ACETAMINOPHEN 325 MG PO TABS
650.0000 mg | ORAL_TABLET | Freq: Four times a day (QID) | ORAL | Status: DC | PRN
  Administered 2019-06-08: 650 mg via ORAL
  Filled 2019-06-07: qty 2

## 2019-06-07 MED ORDER — VANCOMYCIN HCL 2000 MG/400ML IV SOLN
2000.0000 mg | Freq: Once | INTRAVENOUS | Status: AC
  Administered 2019-06-07: 2000 mg via INTRAVENOUS
  Filled 2019-06-07: qty 400

## 2019-06-07 NOTE — H&P (Signed)
[facility] History and Physical  BRISTON LAX BZJ:[id number] DOB: [birth year] DOA: 06/06/2019  PCP: [place], [place]  Patient coming from: Home  Chief Complaint: Boil with drainage  HPI: Spencer Baker is a 29 y.o. male with a medical history of hypertension, on no home medications, obesity, who presented to the emergency department with a "boil" on his right groin which she noticed approximately 3 days ago. It started as an ingrown hair, which his wife pulled out and tried to squeeze the area. He noticed that there was some pus coming out. Patient noted increasing pain and swelling over the next 2 to 3 days. He states this has occurred in other areas before, such as armpits abdomen and other pubic areas. He states he has had no intervention in the past. Patient has not noted any nausea or vomiting, abdominal pain, chest pain, shortness of breath, change in bowel pattern, problems with urination, dizziness or headache. He has complained of fever at home and decreased appetite.  ED Course: Patient found to have sepsis with leukocytosis, fever, tachycardia. CT obtained showing cellulitis without evidence of fluid collection or drainable abscess. Patient was started on IV fluids as well as antibiotics. TRH called for admission.  Review of Systems:  All other systems reviewed and are negative.   History reviewed. No pertinent past medical history.  History reviewed. No pertinent surgical history.  Social History:  reports that he has been smoking cigarettes. He has been smoking about 0.50 packs per day. He has never used smokeless tobacco. He reports that he does not drink alcohol or use drugs.   No Known Allergies  History reviewed. Denies family history of heart disease, diabetes.  Prior to Admission medications   Not on File    Physical Exam: Vitals:   06/07/19 0504 06/07/19 0530  BP:  (!) 164/73  Pulse:  (!) 111  Resp:  17  Temp: (!) 101 F (38.3  C)   SpO2:  97%     General: Well developed, well nourished, NAD, appears stated age  HEENT: NCAT, PERRLA, EOMI, Anicteic Sclera, mucous membranes moist.   Neck: Supple, no JVD, no masses  Cardiovascular: S1 S2 auscultated, no rubs, murmurs or gallops. Regular rate and rhythm.  Respiratory: Clear to auscultation bilaterally with equal chest rise  Abdomen: Soft, obese, nontender, nondistended, + bowel sounds  Extremities: warm dry without cyanosis clubbing or edema  Genitourinary: Normal penile shaft and scrotal area. Patient with extensive  induration and erythema across the mons pubis with a small area of purulent drainage on the right side. No crepitus noted.  Neuro: AAOx3, cranial nerves grossly intact. Strength 5/5 in patient's upper and lower extremities bilaterally  Skin: Without rashes exudates or nodules  Psych: Normal affect and demeanor with intact judgement and insight  Labs on Admission: I have personally reviewed following labs and imaging studies CBC: Recent Labs  Lab 06/06/19 2222 06/07/19 0748  WBC 19.2* 17.9*  NEUTROABS 14.7*  --   HGB 14.3 13.3  HCT 42.9 40.7  MCV 85.6 88.5  PLT 238 017   Basic Metabolic Panel: Recent Labs  Lab 06/06/19 2222 06/07/19 0748  NA 135  --   K 2.9*  --   CL 103  --   CO2 23  --   GLUCOSE 118*  --   BUN 17  --   CREATININE 1.11 0.98  CALCIUM 8.8*  --    GFR: Estimated Creatinine Clearance: 153.1 mL/min (by C-G formula based on  SCr of 0.98 mg/dL). Liver Function Tests: No results for input(s): AST, ALT, ALKPHOS, BILITOT, PROT, ALBUMIN in the last 168 hours. No results for input(s): LIPASE, AMYLASE in the last 168 hours. No results for input(s): AMMONIA in the last 168 hours. Coagulation Profile: Recent Labs  Lab 06/06/19 2314  INR 1.0   Cardiac Enzymes: No results for input(s): CKTOTAL, CKMB, CKMBINDEX, TROPONINI in the last 168 hours. BNP (last 3 results) No results for input(s): PROBNP in the last 8760  hours. HbA1C: No results for input(s): HGBA1C in the last 72 hours. CBG: No results for input(s): GLUCAP in the last 168 hours. Lipid Profile: No results for input(s): CHOL, HDL, LDLCALC, TRIG, CHOLHDL, LDLDIRECT in the last 72 hours. Thyroid Function Tests: No results for input(s): TSH, T4TOTAL, FREET4, T3FREE, THYROIDAB in the last 72 hours. Anemia Panel: No results for input(s): VITAMINB12, FOLATE, FERRITIN, TIBC, IRON, RETICCTPCT in the last 72 hours. Urine analysis:    Component Value Date/Time   COLORURINE YELLOW 06/07/2019 0050   APPEARANCEUR CLEAR 06/07/2019 0050   LABSPEC 1.032 (H) 06/07/2019 0050   PHURINE 5.0 06/07/2019 0050   GLUCOSEU NEGATIVE 06/07/2019 0050   HGBUR LARGE (A) 06/07/2019 0050   BILIRUBINUR NEGATIVE 06/07/2019 0050   KETONESUR NEGATIVE 06/07/2019 0050   PROTEINUR NEGATIVE 06/07/2019 0050   NITRITE NEGATIVE 06/07/2019 0050   LEUKOCYTESUR NEGATIVE 06/07/2019 0050   Sepsis Labs: @LABRCNTIP (procalcitonin:4,lacticidven:4) ) Recent Results (from the past 240 hour(s))  Blood culture (routine x 2)     Status: None (Preliminary result)   Collection Time: 06/06/19 10:22 PM   Specimen: BLOOD LEFT FOREARM  Result Value Ref Range Status   Specimen Description BLOOD LEFT FOREARM  Final   Special Requests   Final    BOTTLES DRAWN AEROBIC AND ANAEROBIC Blood Culture adequate volume   Culture   Final    NO GROWTH < 12 HOURS Performed at [facility], [address], [place], [place]    Report Status PENDING  Incomplete  Blood culture (routine x 2)     Status: None (Preliminary result)   Collection Time: 06/06/19 11:14 PM   Specimen: BLOOD  Result Value Ref Range Status   Specimen Description BLOOD LEFT ANTECUBITAL  Final   Special Requests   Final    BOTTLES DRAWN AEROBIC AND ANAEROBIC Blood Culture adequate volume   Culture   Final    NO GROWTH < 12 HOURS Performed at [facility], [address], [place], [place]    Report Status  PENDING  Incomplete  Respiratory Panel by RT PCR (Flu A&B, Covid) - Nasopharyngeal Swab     Status: None   Collection Time: 06/07/19  1:47 AM   Specimen: Nasopharyngeal Swab  Result Value Ref Range Status   SARS Coronavirus 2 by RT PCR NEGATIVE NEGATIVE Final    Comment: (NOTE) SARS-CoV-2 target nucleic acids are NOT DETECTED. The SARS-CoV-2 RNA is generally detectable in upper respiratoy specimens during the acute phase of infection. The lowest concentration of SARS-CoV-2 viral copies this assay can detect is 131 copies/mL. A negative result does not preclude SARS-Cov-2 infection and should not be used as the sole basis for treatment or other patient management decisions. A negative result may occur with  improper specimen collection/handling, submission of specimen other than nasopharyngeal swab, presence of viral mutation(s) within the areas targeted by this assay, and inadequate number of viral copies (<131 copies/mL). A negative result must be combined with clinical observations, patient history, and epidemiological information. The  expected result is Negative. Fact Sheet for Patients:  https://www.moore.com/ Fact Sheet for Healthcare Providers:  https://www.young.biz/ This test is not yet ap proved or cleared by the [place] FDA and  has been authorized for detection and/or diagnosis of SARS-CoV-2 by FDA under an Emergency Use Authorization (EUA). This EUA will remain  in effect (meaning this test can be used) for the duration of the COVID-19 declaration under Section 564(b)(1) of the Act, 21 U.S.C. section 360bbb-3(b)(1), unless the authorization is terminated or revoked sooner.    Influenza A by PCR NEGATIVE NEGATIVE Final   Influenza B by PCR NEGATIVE NEGATIVE Final    Comment: (NOTE) The Xpert Xpress SARS-CoV-2/FLU/RSV assay is intended as an aid in  the diagnosis of influenza from Nasopharyngeal swab specimens and  should not  be used as a sole basis for treatment. Nasal washings and  aspirates are unacceptable for Xpert Xpress SARS-CoV-2/FLU/RSV  testing. Fact Sheet for Patients: https://www.moore.com/ Fact Sheet for Healthcare Providers: https://www.young.biz/ This test is not yet approved or cleared by the [place] FDA and  has been authorized for detection and/or diagnosis of SARS-CoV-2 by  FDA under an Emergency Use Authorization (EUA). This EUA will remain  in effect (meaning this test can be used) for the duration of the  Covid-19 declaration under Section 564(b)(1) of the Act, 21  U.S.C. section 360bbb-3(b)(1), unless the authorization is  terminated or revoked. Performed at [facility], [address], [place], [place] [address]      Radiological Exams on Admission: CT PELVIS W CONTRAST  Result Date: 06/07/2019 CLINICAL DATA:  Right groin abscess. EXAM: CT PELVIS WITH CONTRAST TECHNIQUE: Multidetector CT imaging of the pelvis was performed using the standard protocol following the bolus administration of intravenous contrast. CONTRAST:  OMNIPAQUE IOHEXOL 300 MG/ML  SOLN COMPARISON:  None. FINDINGS: Urinary Tract:  No abnormality visualized. Bowel:  Unremarkable visualized pelvic bowel loops. Vascular/Lymphatic: No pathologically enlarged lymph nodes. No significant vascular abnormality seen. Reproductive:  No mass or other significant abnormality Other: Marked severity anterior, diffuse subcutaneous inflammatory fat stranding is seen along the midline of the lower pelvic wall. This is most prominent within the region above the penile shaft, right greater than left. Mild extension along the anterior aspect of the right groin is seen. There is mild associated cutaneous thickening. No associated fluid collection or abscess is identified. Mild bilateral inguinal lymphadenopathy is seen. Musculoskeletal: No suspicious bone lesions identified. IMPRESSION: 1. Marked  severity cellulitis along the anterior aspect of the lower pelvic wall, within the region above the penile shaft. 2. No associated fluid collection or abscess. Electronically Signed   By: Aram Candela M.D.   On: 06/07/2019 01:00    EKG: Independently reviewed. Sinus Tachycardia, rate 108  Assessment/Plan  Sepsis secondary to cellulitis -Patient was febrile with tachycardia, tachypnea and leukocytosis -On exam, patient does have small area with purulent drainage noted in the suprapubic/mons area -Question of patient does have hidradenitis as he has had other areas like this located on the armpits as well as the abdomen -CT office with contrast showed marked severity of cellulitis along the anterior aspect of the lower pelvic wall in the region above the penile shaft. No collection or abscess noted. -Discussed with general surgery, Dr. Henreitta Leber, feels that urology consultation may be needed. -Have ordered cellulitis order set-have placed patient on IV vancomycin, cefepime, IV fluids, pain control  -Discussed with Dr. Colin Benton, agreed with current antibiotics. Recommended to continue IV antibiotics while hospitalized, and discharged  with Bactrim with outpatient follow-up. If patient decompensates or has crepitus in the penile area, would see while hospitalized.  Hypertension -Currently on no home medications -Will order as needed medications  Hypokalemia -Will replace and continue to monitor BMP -Will check magnesium level  Morbid obesity -BMI greater than 40 -Patient will need discuss lifestyle modifications with PCP  DVT prophylaxis: Lovenox  Code Status: Full  Family Communication: None at bedside.    Disposition Plan: Home   Consults called: General surgery, Urology   Admission status: Admitted. Given degree of sepsis with continued fever, suspect patient will need at least 48 hours of IV antibiotics.  Time spent: 70 minutes  Jacarra Bobak D.O. [facility]  Between 7am to 7pm - Please see pager noted on [email or web address]  After 7pm go to [email or web address] 06/07/2019, 10:35 AM

## 2019-06-07 NOTE — Progress Notes (Signed)
Pharmacy Antibiotic Note  Spencer Baker is a 29 y.o. male admitted on 06/06/2019 with sepsis due to right grin abscess and fever.  Pharmacy has been consulted for cefepime dosing.  Plan: Start cefepime 2g IV q8h Monitor cultures, renal function and daily progress.  Height: 5\' 10"  (177.8 cm) Weight: 136.1 kg (300 lb) IBW/kg (Calculated) : 73  Temp (24hrs), Avg:101.3 F (38.5 C), Min:100.3 F (37.9 C), Max:103.2 F (39.6 C)  Recent Labs  Lab 06/06/19 2222 06/06/19 2223 06/07/19 0014 06/07/19 0748  WBC 19.2*  --   --  17.9*  CREATININE 1.11  --   --  0.98  LATICACIDVEN  --  0.7 0.9  --     Estimated Creatinine Clearance: 153.1 mL/min (by C-G formula based on SCr of 0.98 mg/dL).    No Known Allergies  Antimicrobials this admission: Ceftriaxone 4/8>> x1 dose in ED Cefepime 4/8 >>  Vancomycin 4/8>>   Microbiology results:  4/8 [facility] x2: ng<12h 4/9 UCx:   4/9 Resp PCR: SARS CoV-2 negative; Flu A/B negative    Thank you for allowing pharmacy to be a part of this patient's care.  6/9 06/07/2019 11:12 AM

## 2019-06-07 NOTE — Progress Notes (Signed)
Pharmacy Antibiotic Note  Spencer Baker is a 29 y.o. male admitted on 06/06/2019 with R groin abscess and fever.  Pharmacy has been consulted for Vancomycin dosing.  Plan: Vancomycin 2000mg  IV now then 1500 mg IV Q 12 hrs. Goal AUC 400-550. Expected AUC: 502 SCr used: 1.11, Vd coeff 0.5 Will f/u renal function, micro data, and pt's clinical condition Vanc levels prn   Height: 5\' 10"  (177.8 cm) Weight: 136.1 kg (300 lb) IBW/kg (Calculated) : 73  Temp (24hrs), Avg:102 F (38.9 C), Min:100.7 F (38.2 C), Max:103.2 F (39.6 C)  Recent Labs  Lab 06/06/19 2222 06/06/19 2223  WBC 19.2*  --   CREATININE 1.11  --   LATICACIDVEN  --  0.7    Estimated Creatinine Clearance: 135.2 mL/min (by C-G formula based on SCr of 1.11 mg/dL).    No Known Allergies  Antimicrobials this admission: 4/9 Vanc >>   Microbiology results: 4/8 BCx:   UCx:    Thank you for allowing pharmacy to be a part of this patient's care.  6/9, PharmD, BCPS Please see amion for complete clinical pharmacist phone list 06/07/2019 12:01 AM

## 2019-06-07 NOTE — Progress Notes (Signed)
[facility]  Reviewed imaging CT. Given the location in the mons and proximity to the penis. I would ask Urology to weigh in. Cellulitis at this time can be treated with antibiotics, but this could progress to more of a fournier's picture.   Recommend Urology consult.  Have sent Dr. Catha Gosselin a message.   Algis Greenhouse, MD [facility] [address] [place], [place] [address] [phone] (office)

## 2019-06-08 DIAGNOSIS — L03818 Cellulitis of other sites: Secondary | ICD-10-CM

## 2019-06-08 DIAGNOSIS — Z6841 Body Mass Index (BMI) 40.0 and over, adult: Secondary | ICD-10-CM

## 2019-06-08 LAB — CBC
HCT: 41.2 % (ref 39.0–52.0)
Hemoglobin: 13.5 g/dL (ref 13.0–17.0)
MCH: 28.8 pg (ref 26.0–34.0)
MCHC: 32.8 g/dL (ref 30.0–36.0)
MCV: 88 fL (ref 80.0–100.0)
Platelets: 204 10*3/uL (ref 150–400)
RBC: 4.68 MIL/uL (ref 4.22–5.81)
RDW: 13.5 % (ref 11.5–15.5)
WBC: 19.3 10*3/uL — ABNORMAL HIGH (ref 4.0–10.5)
nRBC: 0 % (ref 0.0–0.2)

## 2019-06-08 LAB — COMPREHENSIVE METABOLIC PANEL
ALT: 29 U/L (ref 0–44)
AST: 24 U/L (ref 15–41)
Albumin: 3.4 g/dL — ABNORMAL LOW (ref 3.5–5.0)
Alkaline Phosphatase: 60 U/L (ref 38–126)
Anion gap: 11 (ref 5–15)
BUN: 9 mg/dL (ref 6–20)
CO2: 23 mmol/L (ref 22–32)
Calcium: 8.8 mg/dL — ABNORMAL LOW (ref 8.9–10.3)
Chloride: 103 mmol/L (ref 98–111)
Creatinine, Ser: 0.89 mg/dL (ref 0.61–1.24)
GFR calc Af Amer: >60 mL/min (ref >60)
GFR calc non Af Amer: >60 mL/min (ref >60)
Glucose, Bld: 103 mg/dL — ABNORMAL HIGH (ref 70–99)
Potassium: 3.6 mmol/L (ref 3.5–5.1)
Sodium: 137 mmol/L (ref 135–145)
Total Bilirubin: 0.8 mg/dL (ref 0.3–1.2)
Total Protein: 7.1 g/dL (ref 6.5–8.1)

## 2019-06-08 LAB — URINE CULTURE: Culture: NO GROWTH

## 2019-06-08 NOTE — Progress Notes (Signed)
PROGRESS NOTE    Spencer Baker  JJH:[id number] DOB: October 03, 1990 DOA: 06/06/2019 PCP: [facility]     Brief Narrative:  As per H&P written by Dr. Catha Gosselin on 06/07/2019 29 y.o. male with a medical history of hypertension, on no home medications, obesity, who presented to the emergency department with a "boil" on his right groin which she noticed approximately 3 days ago. It started as an ingrown hair, which his wife pulled out and tried to squeeze the area. He noticed that there was some pus coming out. Patient noted increasing pain and swelling over the next 2 to 3 days. He states this has occurred in other areas before, such as armpits abdomen and other pubic areas. He states he has had no intervention in the past. Patient has not noted any nausea or vomiting, abdominal pain, chest pain, shortness of breath, change in bowel pattern, problems with urination, dizziness or headache. He has complained of fever at home and decreased appetite.  ED Course: Patient found to have sepsis with leukocytosis, fever, tachycardia. CT obtained showing cellulitis without evidence of fluid collection or drainable abscess. Patient was started on IV fluids as well as antibiotics. [facility] called for admission.  Assessment & Plan: 1-sepsis secondary to cellulitis -No organ dysfunction appreciated -Sepsis features improving; but he still with elevated WBCs and low-grade temperature. -Case and images discussed by admitting physician with urology service, recommendations given for IV antibiotics and supportive care with outpatient follow-up at discharge. -No drainable abscess at the moment. -Continue current IV antibiotics, antipyretics and supportive care. -Patient is feeling better.  2-morbid OBESITY -Body mass index is 46.85 kg/m. -Low calorie diet and portion control have been discussed with patient.  3-HYPERTENSION, BENIGN SYSTEMIC -Overall stable and well-controlled -Prior to admission  no using any antihypertensive drugs -Continue to follow vital signs and initiate treatment if required. -Low-sodium diet has been recommended.  4-hypokalemia -Repleted -Will follow electrolytes stability.  DVT prophylaxis: Lovenox Code Status: Full code Family Communication: Wife at bedside. Disposition Plan: Will plan for IV antibiotics until afebrile for 24 hours and further improvement in his WBCs.  No chest pain, no nausea, no vomiting.  Consultants:   General surgery  Urology curbside (Dr. Kathrynn Running; recommended IV antibiotics and outpatient follow-up after discharge if no complications appreciated)  Procedures:   See below for x-ray reports.  Antimicrobials:  Anti-infectives (From admission, onward)   Start     Dose/Rate Route Frequency Ordered Stop   06/07/19 1500  cefTRIAXone (ROCEPHIN) 2 g in sodium chloride 0.9 % 100 mL IVPB  Status:  Discontinued     2 g 200 mL/hr over 30 Minutes Intravenous Every 24 hours 06/07/19 1107 06/07/19 1108   06/07/19 1400  ceFEPIme (MAXIPIME) 2 g in sodium chloride 0.9 % 100 mL IVPB     2 g 200 mL/hr over 30 Minutes Intravenous Every 8 hours 06/07/19 1112     06/07/19 1200  vancomycin (VANCOREADY) IVPB 1500 mg/300 mL     1,500 mg 150 mL/hr over 120 Minutes Intravenous Every 12 hours 06/07/19 0006     06/07/19 0730  vancomycin (VANCOCIN) IVPB 1000 mg/200 mL premix  Status:  Discontinued     1,000 mg 200 mL/hr over 60 Minutes Intravenous  Once 06/07/19 0729 06/07/19 0757   06/07/19 0015  vancomycin (VANCOREADY) IVPB 2000 mg/400 mL     2,000 mg 200 mL/hr over 120 Minutes Intravenous  Once 06/07/19 0000 06/07/19 0255   06/06/19 2345  vancomycin (VANCOCIN) IVPB 1000  mg/200 mL premix  Status:  Discontinued     1,000 mg 200 mL/hr over 60 Minutes Intravenous  Once 06/06/19 2339 06/07/19 0000   06/06/19 2345  cefTRIAXone (ROCEPHIN) 2 g in sodium chloride 0.9 % 100 mL IVPB     2 g 200 mL/hr over 30 Minutes Intravenous  Once 06/06/19 2339  06/07/19 0051       Subjective: Reports feeling better this morning; still with a low-grade temperature overnight.  No nausea, no vomiting, no chest pain or shortness of breath.  Objective: Vitals:   06/08/19 0004 06/08/19 0200 06/08/19 0550 06/08/19 1332  BP: (!) 161/92 (!) 155/86 137/85 131/79  Pulse: 87 77 93 76  Resp: 16 20 18 20   Temp: (!) 100.6 F (38.1 C) 99.5 F (37.5 C) 98.3 F (36.8 C) 99.2 F (37.3 C)  TempSrc: Oral Oral    SpO2: 98% 98% 100% 98%  Weight:      Height:        Intake/Output Summary (Last 24 hours) at 06/08/2019 1356 Last data filed at 06/08/2019 1200 Gross per 24 hour  Intake 2212.67 ml  Output 1710 ml  Net 502.67 ml   Filed Weights   06/06/19 2131 06/07/19 1800  Weight: 136.1 kg (!) 148.1 kg    Examination: General exam: Alert, awake, oriented x 3; denies nausea or vomiting; currently afebrile.  No chest pain, no shortness of breath.  T-max 100.6 overnight. Respiratory system: Clear to auscultation. Respiratory effort normal. Cardiovascular system:RRR. No murmurs, rubs, gallops. Gastrointestinal system: Abdomen is obese, soft, nondistended, soft and nontender. No organomegaly or masses felt. Normal bowel sounds heard. Central nervous system: Alert and oriented. No focal neurological deficits. Extremities: No cyanosis or clubbing. Skin: No petechiae.  Improve induration and erythema across the mons pubis with a small area of purulent drainage on the right side.  No crepitus on palpation. Psychiatry: Judgement and insight appear normal. Mood & affect appropriate.     Data Reviewed: I have personally reviewed following labs and imaging studies  CBC: Recent Labs  Lab 06/06/19 2222 06/07/19 0748 06/08/19 0603  WBC 19.2* 17.9* 19.3*  NEUTROABS 14.7*  --   --   HGB 14.3 13.3 13.5  HCT 42.9 40.7 41.2  MCV 85.6 88.5 88.0  PLT 238 208 299   Basic Metabolic Panel: Recent Labs  Lab 06/06/19 2222 06/07/19 0748 06/08/19 0603  NA 135  --   137  K 2.9*  --  3.6  CL 103  --  103  CO2 23  --  23  GLUCOSE 118*  --  103*  BUN 17  --  9  CREATININE 1.11 0.98 0.89  CALCIUM 8.8*  --  8.8*  MG  --  1.9  --    GFR: Estimated Creatinine Clearance: 176.8 mL/min (by C-G formula based on SCr of 0.89 mg/dL).   Liver Function Tests: Recent Labs  Lab 06/08/19 0603  AST 24  ALT 29  ALKPHOS 60  BILITOT 0.8  PROT 7.1  ALBUMIN 3.4*   Coagulation Profile: Recent Labs  Lab 06/06/19 2314  INR 1.0   Urine analysis:    Component Value Date/Time   COLORURINE YELLOW 06/07/2019 0050   APPEARANCEUR CLEAR 06/07/2019 0050   LABSPEC 1.032 (H) 06/07/2019 0050   PHURINE 5.0 06/07/2019 0050   GLUCOSEU NEGATIVE 06/07/2019 0050   HGBUR LARGE (A) 06/07/2019 0050   BILIRUBINUR NEGATIVE 06/07/2019 0050   KETONESUR NEGATIVE 06/07/2019 0050   PROTEINUR NEGATIVE 06/07/2019 0050   NITRITE  NEGATIVE 06/07/2019 0050   LEUKOCYTESUR NEGATIVE 06/07/2019 0050    Recent Results (from the past 240 hour(s))  Blood culture (routine x 2)     Status: None (Preliminary result)   Collection Time: 06/06/19 10:22 PM   Specimen: BLOOD LEFT FOREARM  Result Value Ref Range Status   Specimen Description BLOOD LEFT FOREARM  Final   Special Requests   Final    BOTTLES DRAWN AEROBIC AND ANAEROBIC Blood Culture adequate volume   Culture   Final    NO GROWTH 2 DAYS Performed at [facility], [address], [place], [place] [address]    Report Status PENDING  Incomplete  Blood culture (routine x 2)     Status: None (Preliminary result)   Collection Time: 06/06/19 11:14 PM   Specimen: BLOOD  Result Value Ref Range Status   Specimen Description BLOOD LEFT ANTECUBITAL  Final   Special Requests   Final    BOTTLES DRAWN AEROBIC AND ANAEROBIC Blood Culture adequate volume   Culture   Final    NO GROWTH 2 DAYS Performed at [facility], [address] [place], [place] [address]    Report Status PENDING  Incomplete  Urine culture     Status: None    Collection Time: 06/07/19 12:50 AM   Specimen: In/Out Cath Urine  Result Value Ref Range Status   Specimen Description   Final    IN/OUT CATH URINE Performed at [facility], [address], [place], [place] [address]    Special Requests   Final    NONE Performed at [facility], [address], [place], [place] [address]    Culture   Final    NO GROWTH Performed at [facility], [address] [place], [place] [address]    Report Status 06/08/2019 FINAL  Final  Respiratory Panel by RT PCR (Flu A&B, Covid) - Nasopharyngeal Swab     Status: None   Collection Time: 06/07/19  1:47 AM   Specimen: Nasopharyngeal Swab  Result Value Ref Range Status   SARS Coronavirus 2 by RT PCR NEGATIVE NEGATIVE Final    Comment: (NOTE) SARS-CoV-2 target nucleic acids are NOT DETECTED. The SARS-CoV-2 RNA is generally detectable in upper respiratoy specimens during the acute phase of infection. The lowest concentration of SARS-CoV-2 viral copies this assay can detect is 131 copies/mL. A negative result does not preclude SARS-Cov-2 infection and should not be used as the sole basis for treatment or other patient management decisions. A negative result may occur with  improper specimen collection/handling, submission of specimen other than nasopharyngeal swab, presence of viral mutation(s) within the areas targeted by this assay, and inadequate number of viral copies (<131 copies/mL). A negative result must be combined with clinical observations, patient history, and epidemiological information. The expected result is Negative. Fact Sheet for Patients:  https://www.moore.com/ Fact Sheet for Healthcare Providers:  https://www.young.biz/ This test is not yet ap proved or cleared by the [place] FDA and  has been authorized for detection and/or diagnosis of SARS-CoV-2 by FDA under an Emergency Use Authorization (EUA). This EUA will remain  in effect  (meaning this test can be used) for the duration of the COVID-19 declaration under Section 564(b)(1) of the Act, 21 U.S.C. section 360bbb-3(b)(1), unless the authorization is terminated or revoked sooner.    Influenza A by PCR NEGATIVE NEGATIVE Final   Influenza B by PCR NEGATIVE NEGATIVE Final    Comment: (NOTE) The Xpert Xpress SARS-CoV-2/FLU/RSV assay is intended as an  aid in  the diagnosis of influenza from Nasopharyngeal swab specimens and  should not be used as a sole basis for treatment. Nasal washings and  aspirates are unacceptable for Xpert Xpress SARS-CoV-2/FLU/RSV  testing. Fact Sheet for Patients: https://www.moore.com/ Fact Sheet for Healthcare Providers: https://www.young.biz/ This test is not yet approved or cleared by the [place] FDA and  has been authorized for detection and/or diagnosis of SARS-CoV-2 by  FDA under an Emergency Use Authorization (EUA). This EUA will remain  in effect (meaning this test can be used) for the duration of the  Covid-19 declaration under Section 564(b)(1) of the Act, 21  U.S.C. section 360bbb-3(b)(1), unless the authorization is  terminated or revoked. Performed at [facility], [address], [place], [place] [address]      Radiology Studies: CT PELVIS W CONTRAST  Result Date: 06/07/2019 CLINICAL DATA:  Right groin abscess. EXAM: CT PELVIS WITH CONTRAST TECHNIQUE: Multidetector CT imaging of the pelvis was performed using the standard protocol following the bolus administration of intravenous contrast. CONTRAST:  OMNIPAQUE IOHEXOL 300 MG/ML  SOLN COMPARISON:  None. FINDINGS: Urinary Tract:  No abnormality visualized. Bowel:  Unremarkable visualized pelvic bowel loops. Vascular/Lymphatic: No pathologically enlarged lymph nodes. No significant vascular abnormality seen. Reproductive:  No mass or other significant abnormality Other: Marked severity anterior, diffuse subcutaneous inflammatory  fat stranding is seen along the midline of the lower pelvic wall. This is most prominent within the region above the penile shaft, right greater than left. Mild extension along the anterior aspect of the right groin is seen. There is mild associated cutaneous thickening. No associated fluid collection or abscess is identified. Mild bilateral inguinal lymphadenopathy is seen. Musculoskeletal: No suspicious bone lesions identified. IMPRESSION: 1. Marked severity cellulitis along the anterior aspect of the lower pelvic wall, within the region above the penile shaft. 2. No associated fluid collection or abscess. Electronically Signed   By: Aram Candela M.D.   On: 06/07/2019 01:00    Scheduled Meds: . enoxaparin (LOVENOX) injection  70 mg Subcutaneous Q24H   Continuous Infusions: . sodium chloride 75 mL/hr at 06/08/19 0619  . ceFEPime (MAXIPIME) IV 2 g (06/08/19 0618)  . vancomycin 1,500 mg (06/08/19 1115)     LOS: 1 day    Time spent: 35 minutes    Vassie Loll, MD [facility] Pager [phone]   06/08/2019, 1:56 PM

## 2019-06-09 ENCOUNTER — Inpatient Hospital Stay (HOSPITAL_COMMUNITY): Payer: 59

## 2019-06-09 LAB — BASIC METABOLIC PANEL WITH GFR
Anion gap: 9 (ref 5–15)
BUN: 11 mg/dL (ref 6–20)
CO2: 23 mmol/L (ref 22–32)
Calcium: 8.4 mg/dL — ABNORMAL LOW (ref 8.9–10.3)
Chloride: 105 mmol/L (ref 98–111)
Creatinine, Ser: 0.94 mg/dL (ref 0.61–1.24)
GFR calc Af Amer: >60 mL/min
GFR calc non Af Amer: >60 mL/min
Glucose, Bld: 135 mg/dL — ABNORMAL HIGH (ref 70–99)
Potassium: 3.2 mmol/L — ABNORMAL LOW (ref 3.5–5.1)
Sodium: 137 mmol/L (ref 135–145)

## 2019-06-09 LAB — CBC
HCT: 39.9 % (ref 39.0–52.0)
Hemoglobin: 13.1 g/dL (ref 13.0–17.0)
MCH: 29 pg (ref 26.0–34.0)
MCHC: 32.8 g/dL (ref 30.0–36.0)
MCV: 88.5 fL (ref 80.0–100.0)
Platelets: 221 10*3/uL (ref 150–400)
RBC: 4.51 MIL/uL (ref 4.22–5.81)
RDW: 13.8 % (ref 11.5–15.5)
WBC: 14.9 10*3/uL — ABNORMAL HIGH (ref 4.0–10.5)
nRBC: 0 % (ref 0.0–0.2)

## 2019-06-09 LAB — MAGNESIUM: Magnesium: 2 mg/dL (ref 1.7–2.4)

## 2019-06-09 MED ORDER — IBUPROFEN 400 MG PO TABS
400.0000 mg | ORAL_TABLET | Freq: Three times a day (TID) | ORAL | Status: DC
  Administered 2019-06-09 – 2019-06-11 (×7): 400 mg via ORAL
  Filled 2019-06-09 (×7): qty 1

## 2019-06-09 MED ORDER — POTASSIUM CHLORIDE CRYS ER 20 MEQ PO TBCR: 40.0000 meq | EXTENDED_RELEASE_TABLET | Freq: Once | ORAL | Status: DC

## 2019-06-09 MED ORDER — POTASSIUM CHLORIDE CRYS ER 20 MEQ PO TBCR
40.0000 meq | EXTENDED_RELEASE_TABLET | Freq: Once | ORAL | Status: AC
  Administered 2019-06-09: 40 meq via ORAL
  Filled 2019-06-09: qty 2

## 2019-06-09 MED ORDER — IOHEXOL 300 MG/ML  SOLN
100.0000 mL | Freq: Once | INTRAMUSCULAR | Status: AC | PRN
  Administered 2019-06-09: 100 mL via INTRAVENOUS

## 2019-06-09 NOTE — Progress Notes (Signed)
PROGRESS NOTE    Spencer Baker  JFH:[id number] DOB: 08-11-1990 DOA: 06/06/2019 PCP: [facility]     Brief Narrative:  As per H&P written by Dr. Catha Gosselin on 06/07/2019 29 y.o. male with a medical history of hypertension, on no home medications, obesity, who presented to the emergency department with a "boil" on his right groin which she noticed approximately 3 days ago. It started as an ingrown hair, which his wife pulled out and tried to squeeze the area. He noticed that there was some pus coming out. Patient noted increasing pain and swelling over the next 2 to 3 days. He states this has occurred in other areas before, such as armpits abdomen and other pubic areas. He states he has had no intervention in the past. Patient has not noted any nausea or vomiting, abdominal pain, chest pain, shortness of breath, change in bowel pattern, problems with urination, dizziness or headache. He has complained of fever at home and decreased appetite.  ED Course: Patient found to have sepsis with leukocytosis, fever, tachycardia. CT obtained showing cellulitis without evidence of fluid collection or drainable abscess. Patient was started on IV fluids as well as antibiotics. [facility] called for admission.  Assessment & Plan: 1-sepsis secondary to cellulitis -No organ dysfunction appreciated -Sepsis features overall improving; but he still has elevated WBCs and is spiking fever. -Case and images discussed by admitting physician with urology service, recommendations given for IV antibiotics and supportive care with outpatient follow-up at discharge. -Continue current IV antibiotics, antipyretics and supportive care. -Patient is reports area to be more swollen, with increased induration and tenderness. -Scant purulent drainage appreciated on examination today. -Repeat CT with contrast, start scheduled ibuprofen and warm compresses. -Follow clinical response.  2-morbid OBESITY -Body mass  index is 46.85 kg/m. -Low calorie diet and portion control have been discussed with patient.  3-HYPERTENSION, BENIGN SYSTEMIC -Overall stable and well-controlled -Prior to admission no using any antihypertensive drugs -Continue to follow vital signs and initiate treatment if required. -Low-sodium diet has been recommended.  4-hypokalemia -Continue to replete as needed. -Will follow electrolytes trend. DVT prophylaxis: Lovenox Code Status: Full code Family Communication: Wife at bedside. Disposition Plan: Will plan for IV antibiotics until afebrile for 24 hours and further improvement in his WBCs.  No chest pain, no nausea, no vomiting.  Pain has worsened, area feeling more indurated and tender to palpation.  Homero Fellers scant purulent discharge appreciated.  Consultants:   General surgery  Urology curbside (Dr. Kathrynn Running; recommended IV antibiotics and outpatient follow-up after discharge if no complications appreciated)  Procedures:   See below for x-ray reports.  Antimicrobials:  Anti-infectives (From admission, onward)   Start     Dose/Rate Route Frequency Ordered Stop   06/07/19 1500  cefTRIAXone (ROCEPHIN) 2 g in sodium chloride 0.9 % 100 mL IVPB  Status:  Discontinued     2 g 200 mL/hr over 30 Minutes Intravenous Every 24 hours 06/07/19 1107 06/07/19 1108   06/07/19 1400  ceFEPIme (MAXIPIME) 2 g in sodium chloride 0.9 % 100 mL IVPB     2 g 200 mL/hr over 30 Minutes Intravenous Every 8 hours 06/07/19 1112     06/07/19 1200  vancomycin (VANCOREADY) IVPB 1500 mg/300 mL     1,500 mg 150 mL/hr over 120 Minutes Intravenous Every 12 hours 06/07/19 0006     06/07/19 0730  vancomycin (VANCOCIN) IVPB 1000 mg/200 mL premix  Status:  Discontinued     1,000 mg 200 mL/hr over 60  Minutes Intravenous  Once 06/07/19 0729 06/07/19 0757   06/07/19 0015  vancomycin (VANCOREADY) IVPB 2000 mg/400 mL     2,000 mg 200 mL/hr over 120 Minutes Intravenous  Once 06/07/19 0000 06/07/19 0255   06/06/19  2345  vancomycin (VANCOCIN) IVPB 1000 mg/200 mL premix  Status:  Discontinued     1,000 mg 200 mL/hr over 60 Minutes Intravenous  Once 06/06/19 2339 06/07/19 0000   06/06/19 2345  cefTRIAXone (ROCEPHIN) 2 g in sodium chloride 0.9 % 100 mL IVPB     2 g 200 mL/hr over 30 Minutes Intravenous  Once 06/06/19 2339 06/07/19 0051       Subjective: Still spiking fever, complaining of worsening swelling and pain in his affected area, only scant drainage appreciated.  Objective: Vitals:   06/08/19 2109 06/08/19 2112 06/08/19 2240 06/09/19 0512  BP: (!) 176/105 (!) 158/95  (!) 116/57  Pulse: 94 98  84  Resp: 20 20  20   Temp: (!) 102.2 F (39 C)  98.7 F (37.1 C) 99.1 F (37.3 C)  TempSrc: Oral  Oral Oral  SpO2: 100% 100%  98%  Weight:      Height:        Intake/Output Summary (Last 24 hours) at 06/09/2019 1022 Last data filed at 06/09/2019 0740 Gross per 24 hour  Intake 1749.42 ml  Output 1750 ml  Net -0.58 ml   Filed Weights   06/06/19 2131 06/07/19 1800  Weight: 136.1 kg (!) 148.1 kg    Examination: General exam: Alert, awake, oriented x 3; still spiking fever, no chest pain, no shortness of breath, no nausea, no vomiting.  Reports worsening pain in his pubic area and more swelling. Respiratory system: Clear to auscultation. Respiratory effort normal. Cardiovascular system:RRR. No murmurs, rubs, gallops. Gastrointestinal system: Abdomen is obese, nondistended, soft and nontender. No organomegaly or masses felt. Normal bowel sounds heard. Central nervous system: Alert and oriented. No focal neurological deficits. Extremities: No cyanosis or clubbing. Skin: Pubic area with worsening swelling, tender to palpation, warm sensation and erythematous changes.  Scant discharge appreciated. Psychiatry: Judgement and insight appear normal. Mood & affect appropriate.    Data Reviewed: I have personally reviewed following labs and imaging studies  CBC: Recent Labs  Lab 06/06/19 2222  06/07/19 0748 06/08/19 0603 06/09/19 0558  WBC 19.2* 17.9* 19.3* 14.9*  NEUTROABS 14.7*  --   --   --   HGB 14.3 13.3 13.5 13.1  HCT 42.9 40.7 41.2 39.9  MCV 85.6 88.5 88.0 88.5  PLT 238 208 204 481   Basic Metabolic Panel: Recent Labs  Lab 06/06/19 2222 06/07/19 0748 06/08/19 0603 06/09/19 0558  NA 135  --  137 137  K 2.9*  --  3.6 3.2*  CL 103  --  103 105  CO2 23  --  23 23  GLUCOSE 118*  --  103* 135*  BUN 17  --  9 11  CREATININE 1.11 0.98 0.89 0.94  CALCIUM 8.8*  --  8.8* 8.4*  MG  --  1.9  --  2.0   GFR: Estimated Creatinine Clearance: 167.4 mL/min (by C-G formula based on SCr of 0.94 mg/dL).   Liver Function Tests: Recent Labs  Lab 06/08/19 0603  AST 24  ALT 29  ALKPHOS 60  BILITOT 0.8  PROT 7.1  ALBUMIN 3.4*   Coagulation Profile: Recent Labs  Lab 06/06/19 2314  INR 1.0   Urine analysis:    Component Value Date/Time   COLORURINE YELLOW 06/07/2019  0050   APPEARANCEUR CLEAR 06/07/2019 0050   LABSPEC 1.032 (H) 06/07/2019 0050   PHURINE 5.0 06/07/2019 0050   GLUCOSEU NEGATIVE 06/07/2019 0050   HGBUR LARGE (A) 06/07/2019 0050   BILIRUBINUR NEGATIVE 06/07/2019 0050   KETONESUR NEGATIVE 06/07/2019 0050   PROTEINUR NEGATIVE 06/07/2019 0050   NITRITE NEGATIVE 06/07/2019 0050   LEUKOCYTESUR NEGATIVE 06/07/2019 0050    Recent Results (from the past 240 hour(s))  Blood culture (routine x 2)     Status: None (Preliminary result)   Collection Time: 06/06/19 10:22 PM   Specimen: BLOOD LEFT FOREARM  Result Value Ref Range Status   Specimen Description BLOOD LEFT FOREARM  Final   Special Requests   Final    BOTTLES DRAWN AEROBIC AND ANAEROBIC Blood Culture adequate volume   Culture   Final    NO GROWTH 2 DAYS Performed at [facility], [address], [place], [place] [address]    Report Status PENDING  Incomplete  Blood culture (routine x 2)     Status: None (Preliminary result)   Collection Time: 06/06/19 11:14 PM   Specimen: BLOOD  Result  Value Ref Range Status   Specimen Description BLOOD LEFT ANTECUBITAL  Final   Special Requests   Final    BOTTLES DRAWN AEROBIC AND ANAEROBIC Blood Culture adequate volume   Culture   Final    NO GROWTH 2 DAYS Performed at [facility], [address], [place], [place] [address]    Report Status PENDING  Incomplete  Urine culture     Status: None   Collection Time: 06/07/19 12:50 AM   Specimen: In/Out Cath Urine  Result Value Ref Range Status   Specimen Description   Final    IN/OUT CATH URINE Performed at [facility], [address] [place], [place] [address]    Special Requests   Final    NONE Performed at [facility], [address], [place], [place] [address]    Culture   Final    NO GROWTH Performed at [facility], [address], [place], [place] [address]    Report Status 06/08/2019 FINAL  Final  Respiratory Panel by RT PCR (Flu A&B, Covid) - Nasopharyngeal Swab     Status: None   Collection Time: 06/07/19  1:47 AM   Specimen: Nasopharyngeal Swab  Result Value Ref Range Status   SARS Coronavirus 2 by RT PCR NEGATIVE NEGATIVE Final    Comment: (NOTE) SARS-CoV-2 target nucleic acids are NOT DETECTED. The SARS-CoV-2 RNA is generally detectable in upper respiratoy specimens during the acute phase of infection. The lowest concentration of SARS-CoV-2 viral copies this assay can detect is 131 copies/mL. A negative result does not preclude SARS-Cov-2 infection and should not be used as the sole basis for treatment or other patient management decisions. A negative result may occur with  improper specimen collection/handling, submission of specimen other than nasopharyngeal swab, presence of viral mutation(s) within the areas targeted by this assay, and inadequate number of viral copies (<131 copies/mL). A negative result must be combined with clinical observations, patient history, and epidemiological information. The expected result is Negative. Fact Sheet for  Patients:  https://www.moore.com/ Fact Sheet for Healthcare Providers:  https://www.young.biz/ This test is not yet ap proved or cleared by the [place] FDA and  has been authorized for detection and/or diagnosis of SARS-CoV-2 by FDA under an Emergency Use Authorization (EUA). This EUA will remain  in effect (meaning this test can be used) for the duration of  the COVID-19 declaration under Section 564(b)(1) of the Act, 21 U.S.C. section 360bbb-3(b)(1), unless the authorization is terminated or revoked sooner.    Influenza A by PCR NEGATIVE NEGATIVE Final   Influenza B by PCR NEGATIVE NEGATIVE Final    Comment: (NOTE) The Xpert Xpress SARS-CoV-2/FLU/RSV assay is intended as an aid in  the diagnosis of influenza from Nasopharyngeal swab specimens and  should not be used as a sole basis for treatment. Nasal washings and  aspirates are unacceptable for Xpert Xpress SARS-CoV-2/FLU/RSV  testing. Fact Sheet for Patients: https://www.moore.com/ Fact Sheet for Healthcare Providers: https://www.young.biz/ This test is not yet approved or cleared by the [place] FDA and  has been authorized for detection and/or diagnosis of SARS-CoV-2 by  FDA under an Emergency Use Authorization (EUA). This EUA will remain  in effect (meaning this test can be used) for the duration of the  Covid-19 declaration under Section 564(b)(1) of the Act, 21  U.S.C. section 360bbb-3(b)(1), unless the authorization is  terminated or revoked. Performed at [facility], [address] [place], [place] [address]      Radiology Studies: No results found.  Scheduled Meds: . enoxaparin (LOVENOX) injection  70 mg Subcutaneous Q24H  . ibuprofen  400 mg Oral TID  . potassium chloride  40 mEq Oral Once  . potassium chloride  40 mEq Oral Once   Continuous Infusions: . sodium chloride 75 mL/hr at 06/09/19 0006  . ceFEPime (MAXIPIME) IV  2 g (06/09/19 0538)  . vancomycin Stopped (06/09/19 0207)     LOS: 2 days    Time spent: 35 minutes    Vassie Loll, MD [facility] Pager [phone]   06/09/2019, 10:22 AM

## 2019-06-09 NOTE — Progress Notes (Signed)
Patient is experiencing worsened pain 8/10 in groin area. Area of cellulitis appears to be more swollen, red, warm to touch, and very painful and uncomfortable for the patient. Patient penis now edematous. Area of cellulitis firm and edematous across mons. Area is draining scant amount of purulent drainage. Patient febrile and treated appropriately. Continue to monitor. Cellulitis appears to be worse and causing symptoms of worsened infection in patient.

## 2019-06-10 ENCOUNTER — Encounter (HOSPITAL_COMMUNITY): Payer: Self-pay | Admitting: Internal Medicine

## 2019-06-10 DIAGNOSIS — L732 Hidradenitis suppurativa: Secondary | ICD-10-CM

## 2019-06-10 LAB — BASIC METABOLIC PANEL
Anion gap: 10 (ref 5–15)
BUN: 12 mg/dL (ref 6–20)
CO2: 23 mmol/L (ref 22–32)
Calcium: 8.4 mg/dL — ABNORMAL LOW (ref 8.9–10.3)
Chloride: 106 mmol/L (ref 98–111)
Creatinine, Ser: 0.8 mg/dL (ref 0.61–1.24)
GFR calc Af Amer: >60 mL/min (ref >60)
GFR calc non Af Amer: >60 mL/min (ref >60)
Glucose, Bld: 106 mg/dL — ABNORMAL HIGH (ref 70–99)
Potassium: 3.6 mmol/L (ref 3.5–5.1)
Sodium: 139 mmol/L (ref 135–145)

## 2019-06-10 LAB — MRSA PCR SCREENING: MRSA by PCR: NEGATIVE

## 2019-06-10 MED ORDER — AMLODIPINE BESYLATE 5 MG PO TABS
2.5000 mg | ORAL_TABLET | Freq: Every day | ORAL | Status: DC
  Administered 2019-06-10 – 2019-06-11 (×2): 2.5 mg via ORAL
  Filled 2019-06-10 (×2): qty 1

## 2019-06-10 NOTE — Consult Note (Addendum)
[facility] Consult  Reason for Consult: Suprapubic infection, cellulitis    Chief Complaint    Abscess      HPI: Spencer Baker is a 29 y.o. male with an infection in his mons/ suprapubic region that extended down to his penile shaft. I had spoke with Dr. Catha Gosselin at the time of admission and reviewed the imaging but given the location felt that Urology should be consulted. Urology consulted via phone but did not see the patient, and felt that IV antibiotics were indicated and recommended repeat consult if worsening issues or signs of infection.  Given that the patient was still here this AM and I had spoken to Dr. Catha Gosselin on Friday, I wanted to check on the patient and ensure that he was improving. Over the weekend a repeat CT had been done due to worsening pain and this continued to just show inflammation consistent with cellulitis without any drainable collection.  The patient says he has a history of infections in his armpits and groin area, and that he has never been told he had hidradenitis but that he gets areas that drain on occasion. Dr. Catha Gosselin had felt that it was hidradenitis when she spoke with me.  He now reports feeling better and having less swelling. He says that the area is draining spontaneously.  He has been on Iv antibiotics since admission.   Past Medical History:  Diagnosis Date  . Hypertension     History reviewed. No pertinent surgical history.  History reviewed. No pertinent family history. Reports mother with hidradenitis   Social History   Tobacco Use  . Smoking status: Current Every Day Smoker    Packs/day: 0.50    Types: Cigarettes  . Smokeless tobacco: Never Used  Substance Use Topics  . Alcohol use: No  . Drug use: No    Medications: I have reviewed the patient's current medications. Current Facility-Administered Medications  Medication Dose Route Frequency Provider Last Rate Last Admin  . 0.9 %  sodium chloride infusion    Intravenous Continuous Edsel Petrin, DO 75 mL/hr at 06/09/19 0006 New Bag at 06/09/19 0006  . acetaminophen (TYLENOL) tablet 650 mg  650 mg Oral Q6H PRN Edsel Petrin, DO   650 mg at 06/08/19 2113   Or  . acetaminophen (TYLENOL) suppository 650 mg  650 mg Rectal Q6H PRN Edsel Petrin, DO      . acetaminophen (TYLENOL) tablet 650 mg  650 mg Oral Q6H PRN [name], DO   650 mg at 06/08/19 1125  . ceFEPIme (MAXIPIME) 2 g in sodium chloride 0.9 % 100 mL IVPB  2 g Intravenous Q8H Mikhail, [place], DO 200 mL/hr at 06/10/19 0557 2 g at 06/10/19 0557  . enoxaparin (LOVENOX) injection 70 mg  70 mg Subcutaneous Q24H Edsel Petrin, DO   70 mg at 06/09/19 1610  . HYDROcodone-acetaminophen (NORCO/VICODIN) 5-325 MG per tablet 1-2 tablet  1-2 tablet Oral Q4H PRN Edsel Petrin, DO   1 tablet at 06/08/19 2057  . ibuprofen (ADVIL) tablet 400 mg  400 mg Oral TID Vassie Loll, MD   400 mg at 06/10/19 1024  . ondansetron (ZOFRAN) tablet 4 mg  4 mg Oral Q6H PRN Edsel Petrin, DO       Or  . ondansetron (ZOFRAN) injection 4 mg  4 mg Intravenous Q6H PRN Mikhail, Maryann, DO      . potassium chloride SA (KLOR-CON) CR tablet 40 mEq  40 mEq Oral Once Edsel Petrin, DO      .  senna-docusate (Senokot-S) tablet 1 tablet  1 tablet Oral QHS PRN Edsel Petrin, DO      . vancomycin (VANCOREADY) IVPB 1500 mg/300 mL  1,500 mg Intravenous Q12H Titus Mould, RPH 150 mL/hr at 06/10/19 1109 1,500 mg at 06/10/19 1109    No Known Allergies   ROS:  A comprehensive review of systems was negative except for: Genitourinary: positive for suprapubic pain and inflammation, minor drainage  Blood pressure (!) 151/93, pulse 72, temperature 98.7 F (37.1 C), temperature source Oral, resp. rate 18, height 5\' 10"  (1.778 m), weight 134.1 kg, SpO2 99 %. Physical Exam Vitals reviewed.  Constitutional:      Appearance: He is obese.  HENT:     Head: Normocephalic.     Nose: Nose normal.     Mouth/Throat:      Mouth: Mucous membranes are moist.  Eyes:     Pupils: Pupils are equal, round, and reactive to light.  Cardiovascular:     Rate and Rhythm: Normal rate.  Pulmonary:     Effort: Pulmonary effort is normal.  Abdominal:     General: There is no distension.     Palpations: Abdomen is soft.     Tenderness: There is no abdominal tenderness.  Genitourinary:    Comments: Suprapubic region with scarred pits from prior drainage, swelling and induration but no fluctuance, induration mostly on the right side of mons, extending down to penile shaft, no bullae  Musculoskeletal:        General: Normal range of motion.     Cervical back: Normal range of motion.  Lymphadenopathy:     Comments: Scarring pits in bilateral axilla consistent with hidradenitis   Skin:    General: Skin is warm.  Neurological:     General: No focal deficit present.     Mental Status: He is alert and oriented to person, place, and time.  Psychiatric:        Mood and Affect: Mood normal.        Behavior: Behavior normal.        Thought Content: Thought content normal.        Judgment: Judgment normal.     Results: Results for orders placed or performed during the hospital encounter of 06/06/19 (from the past 48 hour(s))  Basic metabolic panel     Status: Abnormal   Collection Time: 06/09/19  5:58 AM  Result Value Ref Range   Sodium 137 135 - 145 mmol/L   Potassium 3.2 (L) 3.5 - 5.1 mmol/L   Chloride 105 98 - 111 mmol/L   CO2 23 22 - 32 mmol/L   Glucose, Bld 135 (H) 70 - 99 mg/dL    Comment: Glucose reference range applies only to samples taken after fasting for at least 8 hours.   BUN 11 6 - 20 mg/dL   Creatinine, Ser 08/09/19 0.61 - 1.24 mg/dL   Calcium 8.4 (L) 8.9 - 10.3 mg/dL   GFR calc non Af Amer >60 >60 mL/min   GFR calc Af Amer >60 >60 mL/min   Anion gap 9 5 - 15    Comment: Performed at [facility], [address], [place], [place]  CBC     Status: Abnormal   Collection Time: 06/09/19  5:58 AM   Result Value Ref Range   WBC 14.9 (H) 4.0 - 10.5 K/uL   RBC 4.51 4.22 - 5.81 MIL/uL   Hemoglobin 13.1 13.0 - 17.0 g/dL   HCT 08/09/19 92.1 - 19.4 %  MCV 88.5 80.0 - 100.0 fL   MCH 29.0 26.0 - 34.0 pg   MCHC 32.8 30.0 - 36.0 g/dL   RDW 73.2 20.2 - 54.2 %   Platelets 221 150 - 400 K/uL   nRBC 0.0 0.0 - 0.2 %    Comment: Performed at [facility], [address], [place], [place] [address]  Magnesium     Status: None   Collection Time: 06/09/19  5:58 AM  Result Value Ref Range   Magnesium 2.0 1.7 - 2.4 mg/dL    Comment: Performed at [facility], [address], [place], [place] [address]  Basic metabolic panel     Status: Abnormal   Collection Time: 06/10/19  4:33 AM  Result Value Ref Range   Sodium 139 135 - 145 mmol/L   Potassium 3.6 3.5 - 5.1 mmol/L   Chloride 106 98 - 111 mmol/L   CO2 23 22 - 32 mmol/L   Glucose, Bld 106 (H) 70 - 99 mg/dL    Comment: Glucose reference range applies only to samples taken after fasting for at least 8 hours.   BUN 12 6 - 20 mg/dL   Creatinine, Ser 1.51 0.61 - 1.24 mg/dL   Calcium 8.4 (L) 8.9 - 10.3 mg/dL   GFR calc non Af Amer >60 >60 mL/min   GFR calc Af Amer >60 >60 mL/min   Anion gap 10 5 - 15    Comment: Performed at [facility], [address], [place], [place] [address]   Personally reviewed CT from 4/11 and 4/9- inflammation of the subcutaneous tissue, no drainable collection, close proximity to the penile shaft CT PELVIS W CONTRAST  Result Date: 06/09/2019 CLINICAL DATA:  Abdominal abscess/infection is suspected. Worsening groin pain. Known cellulitis. EXAM: CT PELVIS WITH CONTRAST TECHNIQUE: Multidetector CT imaging of the pelvis was performed using the standard protocol following the bolus administration of intravenous contrast. CONTRAST:  OMNIPAQUE IOHEXOL 300 MG/ML  SOLN COMPARISON:  06/07/2019 FINDINGS: Urinary Tract:  No acute findings. Bowel:  No acute findings.  Appendix not visualized. Vascular/Lymphatic: Unremarkable.  Reproductive:  Prostate gland is normal. Other: Continued evidence of moderate stranding of the subcutaneous fat over the anterior pelvis in the midline and right of midline extending towards the right inguinal region. There is no focal fluid collection to suggest abscess. No air in the soft tissues. This is compatible patient's known cellulitis. This extends inferiorly to abut the anterior penile shaft. These findings are slightly more pronounced compared to the recent prior exam. Musculoskeletal: Unremarkable. IMPRESSION: Continued evidence of cellulitis over the subcutaneous fat of the anterior pelvis in the midline and right of midline extending towards the right inguinal region as well as inferiorly to abut the anterior penile shaft. Findings are slightly worse, although no focal fluid collection to suggest abscess. No air in the soft tissues. Electronically Signed   By: Elberta Fortis M.D.   On: 06/09/2019 14:30     Assessment & Plan:  DELVON CHIPPS is a 29 y.o. 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 On Friday, given the imaging I had been concerned that given his obesity he could be developing a necrotizing infection but this is not the case given the length of time and improvement in antibiotics.  He says he is feeling better. Urology was consulted but did not see patient in person, and given that he remains in the hospital, I wanted to ensure that someone from surgery  evaluated him and spoke with him.    -No surgical intervention needed, IV antibiotics and transition to po   -We discussed the pathophysiology of hidradenitis and the likelihood that it involves blockages in hair follicles and sweat glands. We discussed that we do not know the exact cause but that this is what is suspected. We discussed that this is a spectrum of disease from small localized areas to extensive disease in the armpits, groin, between the breast and in other  areas where one sweats.  One develops painful, blocked areas in these regions that can become infected and form sinus tracks or tunnels. These areas causing scarring and inflammation, and can leak pus and have an odor.  We discussed the risk factors such as obesity, smoking, metabolic syndrome/ diabetes.  There is also some link between genetics and immune system issues.    Given the spectrum of disease, some hidradenitis can improve with medical therapies and some need excision.  Excision of disease is best left for disease that is very well localized or that is so extensive and persistent that has failed to improve with other treatments.  Surgery for hidradenitis can be very disfiguring and painful. Large open wounds may be left in sensitive areas like the armpit and groin, and the hidradenitis can recur even after excision. Multiple excisions can be necessary as well as long-term wound care with months of packing and even skin grafts.   More recent studies are showing that medical treatments and lifestyle modifications may be more effective at treating this disease and cause less morbidity (pain and suffering) than a large surgical debridement.  We discussed exhausting these medical therapies including potentially biologics or immune modulators and discussion with a dermatologist prior to pursuing any surgical intervention.   -The patient reports very rare infections in the area and in his axilla and understands that there are medical options prior to surgical options.  -Follow up with his PCP.   All questions were answered to the satisfaction of the patient. Discussed with Dr. Nevada Crane.    Virl Cagey 06/10/2019, 12:52 PM

## 2019-06-10 NOTE — Progress Notes (Addendum)
PROGRESS NOTE  DEMETRIOS BYRON HAL:[id number] DOB: [birth year] DOA: 06/06/2019 PCP: [facility]  HPI/Recap of past 24 hours: 29 y.o.malewith a medical history ofhypertension, morbid obesity who presented to the ED with a "boil" on his right groin which he noticed 3 days prior to presentation.  CT obtained showing cellulitis without evidence of fluid collection or drainable abscess.  Started on IV antibiotics empirically. TRH asked to admit.  Leukocytosis trending down but still significantly elevated at 14.9 K.  Will continue IV antibiotics and repeat CBC with diff in the AM.   06/10/19:  Seen and examined.  Some pain in the right groin but tolerable.     Assessment/Plan: Active Problems:   OBESITY, NOS   HYPERTENSION, BENIGN SYSTEMIC   Sepsis due to cellulitis (HCC)   Sepsis 2/2 to R groin severe hydradenitis/cellulitis on CT Presented with Leukocytosis and fever with Tmax 103.2 Currently on broad spectrum IV abx Cefepime and IV vancomycin Obtain MRSA screening Cellulitis on CT no evidence of abscess Blood cx x 2 no growth x 4 days, continue to follow cxs Urology per MR review recs IV abx and follow up outpatient after discharge If improved in the AM could follow up with PCP within a week, then with urology if still needed, discussed with Surgery Dr. Henreitta Leber.  Resolved hypokalemia K+ 3.6 from 3.2 Magnesium 2.0 Replete as indicated  Morbid obesity BMI 42 Recommend weight loss outpatient Follow up with PCP  Essential HTN Per chart review hx of HTN Not on any hypertensive meds at home BP elevated Start Norvasc 2.5 mg daily Monitor vital signs   DVT prophylaxis: Lovenox Code Status: Full code Family Communication: Wife at bedside. Disposition Plan:  Patient is from home.  Anticipate discharge to home in the next 24 to 48 hours once leukocytosis and neutrophilia have improved.  Patient will need to follow-up with urology after  discharge.  Consultants:   General surgery  Urology curbside (Dr. Kathrynn Running; recommended IV antibiotics and outpatient follow-up after discharge if no complications appreciated)  Procedures:   None     Objective: Vitals:   06/09/19 2138 06/09/19 2140 06/10/19 0543 06/10/19 0545  BP:  (!) 147/96 (!) 149/99 (!) 151/93  Pulse: 76 68 (!) 59 72  Resp:   18 18  Temp:   98.7 F (37.1 C) 98.7 F (37.1 C)  TempSrc:   Oral Oral  SpO2: 100% 100% 100% 99%  Weight:    134.1 kg  Height:        Intake/Output Summary (Last 24 hours) at 06/10/2019 1228 Last data filed at 06/10/2019 1109 Gross per 24 hour  Intake --  Output 1825 ml  Net -1825 ml   Filed Weights   06/06/19 2131 06/07/19 1800 06/10/19 0545  Weight: 136.1 kg (!) 148.1 kg 134.1 kg    Exam:  . General: 29 y.o. year-old male morbidly obese in no acute distress.  Alert and oriented x3. . Cardiovascular: Regular rate and rhythm with no rubs or gallops.  No thyromegaly or JVD noted.   Marland Kitchen Respiratory: Clear to auscultation with no wheezes or rales. Good inspiratory effort. . Abdomen: Soft nontender nondistended with normal bowel sounds x4 quadrants. . Musculoskeletal: No lower extremity edema. 2/4 pulses in all 4 extremities. . Skin: Right groin edematous with some clear drainage. Marland Kitchen Psychiatry: Mood is appropriate for condition and setting   Data Reviewed: CBC: Recent Labs  Lab 06/06/19 2222 06/07/19 0748 06/08/19 0603 06/09/19 0558  WBC 19.2* 17.9* 19.3*  14.9*  NEUTROABS 14.7*  --   --   --   HGB 14.3 13.3 13.5 13.1  HCT 42.9 40.7 41.2 39.9  MCV 85.6 88.5 88.0 88.5  PLT 238 208 204 221   Basic Metabolic Panel: Recent Labs  Lab 06/06/19 2222 06/07/19 0748 06/08/19 0603 06/09/19 0558 06/10/19 0433  NA 135  --  137 137 139  K 2.9*  --  3.6 3.2* 3.6  CL 103  --  103 105 106  CO2 23  --  23 23 23   GLUCOSE 118*  --  103* 135* 106*  BUN 17  --  9 11 12   CREATININE 1.11 0.98 0.89 0.94 0.80  CALCIUM 8.8*   --  8.8* 8.4* 8.4*  MG  --  1.9  --  2.0  --    GFR: Estimated Creatinine Clearance: 186 mL/min (by C-G formula based on SCr of 0.8 mg/dL). Liver Function Tests: Recent Labs  Lab 06/08/19 0603  AST 24  ALT 29  ALKPHOS 60  BILITOT 0.8  PROT 7.1  ALBUMIN 3.4*   No results for input(s): LIPASE, AMYLASE in the last 168 hours. No results for input(s): AMMONIA in the last 168 hours. Coagulation Profile: Recent Labs  Lab 06/06/19 2314  INR 1.0   Cardiac Enzymes: No results for input(s): CKTOTAL, CKMB, CKMBINDEX, TROPONINI in the last 168 hours. BNP (last 3 results) No results for input(s): PROBNP in the last 8760 hours. HbA1C: No results for input(s): HGBA1C in the last 72 hours. CBG: No results for input(s): GLUCAP in the last 168 hours. Lipid Profile: No results for input(s): CHOL, HDL, LDLCALC, TRIG, CHOLHDL, LDLDIRECT in the last 72 hours. Thyroid Function Tests: No results for input(s): TSH, T4TOTAL, FREET4, T3FREE, THYROIDAB in the last 72 hours. Anemia Panel: No results for input(s): VITAMINB12, FOLATE, FERRITIN, TIBC, IRON, RETICCTPCT in the last 72 hours. Urine analysis:    Component Value Date/Time   COLORURINE YELLOW 06/07/2019 0050   APPEARANCEUR CLEAR 06/07/2019 0050   LABSPEC 1.032 (H) 06/07/2019 0050   PHURINE 5.0 06/07/2019 0050   GLUCOSEU NEGATIVE 06/07/2019 0050   HGBUR LARGE (A) 06/07/2019 0050   BILIRUBINUR NEGATIVE 06/07/2019 0050   KETONESUR NEGATIVE 06/07/2019 0050   PROTEINUR NEGATIVE 06/07/2019 0050   NITRITE NEGATIVE 06/07/2019 0050   LEUKOCYTESUR NEGATIVE 06/07/2019 0050   Sepsis Labs: @LABRCNTIP (procalcitonin:4,lacticidven:4)  ) Recent Results (from the past 240 hour(s))  Blood culture (routine x 2)     Status: None (Preliminary result)   Collection Time: 06/06/19 10:22 PM   Specimen: BLOOD LEFT FOREARM  Result Value Ref Range Status   Specimen Description BLOOD LEFT FOREARM  Final   Special Requests   Final    BOTTLES DRAWN AEROBIC  AND ANAEROBIC Blood Culture adequate volume   Culture   Final    NO GROWTH 4 DAYS Performed at [facility], [address] [place], [facility] [address]    Report Status PENDING  Incomplete  Blood culture (routine x 2)     Status: None (Preliminary result)   Collection Time: 06/06/19 11:14 PM   Specimen: BLOOD  Result Value Ref Range Status   Specimen Description BLOOD LEFT ANTECUBITAL  Final   Special Requests   Final    BOTTLES DRAWN AEROBIC AND ANAEROBIC Blood Culture adequate volume   Culture   Final    NO GROWTH 4 DAYS Performed at [facility], [address] [place], [facility] [address]    Report Status PENDING  Incomplete  Urine culture  Status: None   Collection Time: 06/07/19 12:50 AM   Specimen: In/Out Cath Urine  Result Value Ref Range Status   Specimen Description   Final    IN/OUT CATH URINE Performed at [facility], [address] [place], [place] [address]    Special Requests   Final    NONE Performed at [facility], [address] [place], [place] [address]    Culture   Final    NO GROWTH Performed at [facility], [address], [place], [place] [address]    Report Status 06/08/2019 FINAL  Final  Respiratory Panel by RT PCR (Flu A&B, Covid) - Nasopharyngeal Swab     Status: None   Collection Time: 06/07/19  1:47 AM   Specimen: Nasopharyngeal Swab  Result Value Ref Range Status   SARS Coronavirus 2 by RT PCR NEGATIVE NEGATIVE Final    Comment: (NOTE) SARS-CoV-2 target nucleic acids are NOT DETECTED. The SARS-CoV-2 RNA is generally detectable in upper respiratoy specimens during the acute phase of infection. The lowest concentration of SARS-CoV-2 viral copies this assay can detect is 131 copies/mL. A negative result does not preclude SARS-Cov-2 infection and should not be used as the sole basis for treatment or other patient management decisions. A negative result may occur with  improper specimen collection/handling, submission of specimen  other than nasopharyngeal swab, presence of viral mutation(s) within the areas targeted by this assay, and inadequate number of viral copies (<131 copies/mL). A negative result must be combined with clinical observations, patient history, and epidemiological information. The expected result is Negative. Fact Sheet for Patients:  https://www.moore.com/ Fact Sheet for Healthcare Providers:  https://www.young.biz/ This test is not yet ap proved or cleared by the [place] FDA and  has been authorized for detection and/or diagnosis of SARS-CoV-2 by FDA under an Emergency Use Authorization (EUA). This EUA will remain  in effect (meaning this test can be used) for the duration of the COVID-19 declaration under Section 564(b)(1) of the Act, 21 U.S.C. section 360bbb-3(b)(1), unless the authorization is terminated or revoked sooner.    Influenza A by PCR NEGATIVE NEGATIVE Final   Influenza B by PCR NEGATIVE NEGATIVE Final    Comment: (NOTE) The Xpert Xpress SARS-CoV-2/FLU/RSV assay is intended as an aid in  the diagnosis of influenza from Nasopharyngeal swab specimens and  should not be used as a sole basis for treatment. Nasal washings and  aspirates are unacceptable for Xpert Xpress SARS-CoV-2/FLU/RSV  testing. Fact Sheet for Patients: https://www.moore.com/ Fact Sheet for Healthcare Providers: https://www.young.biz/ This test is not yet approved or cleared by the [place] FDA and  has been authorized for detection and/or diagnosis of SARS-CoV-2 by  FDA under an Emergency Use Authorization (EUA). This EUA will remain  in effect (meaning this test can be used) for the duration of the  Covid-19 declaration under Section 564(b)(1) of the Act, 21  U.S.C. section 360bbb-3(b)(1), unless the authorization is  terminated or revoked. Performed at [facility], [address] [place], [place] [address]        Studies: No results found.  Scheduled Meds: . enoxaparin (LOVENOX) injection  70 mg Subcutaneous Q24H  . ibuprofen  400 mg Oral TID  . potassium chloride  40 mEq Oral Once    Continuous Infusions: . sodium chloride 75 mL/hr at 06/09/19 0006  . ceFEPime (MAXIPIME) IV 2 g (06/10/19 0557)  . vancomycin 1,500 mg (06/10/19 1109)     LOS: 3 days  Kayleen Memos, MD [facility] Pager [phone]  If 7PM-7AM, please contact night-coverage [email or web address] Password [facility] 06/10/2019, 12:28 PM

## 2019-06-11 LAB — CBC WITH DIFFERENTIAL/PLATELET
Abs Immature Granulocytes: 0.05 10*3/uL (ref 0.00–0.07)
Basophils Absolute: 0 10*3/uL (ref 0.0–0.1)
Basophils Relative: 1 %
Eosinophils Absolute: 0.4 10*3/uL (ref 0.0–0.5)
Eosinophils Relative: 4 %
HCT: 39.2 % (ref 39.0–52.0)
Hemoglobin: 12.6 g/dL — ABNORMAL LOW (ref 13.0–17.0)
Immature Granulocytes: 1 %
Lymphocytes Relative: 15 %
Lymphs Abs: 1.3 10*3/uL (ref 0.7–4.0)
MCH: 28.6 pg (ref 26.0–34.0)
MCHC: 32.1 g/dL (ref 30.0–36.0)
MCV: 88.9 fL (ref 80.0–100.0)
Monocytes Absolute: 0.9 10*3/uL (ref 0.1–1.0)
Monocytes Relative: 10 %
Neutro Abs: 6.1 10*3/uL (ref 1.7–7.7)
Neutrophils Relative %: 69 %
Platelets: 266 10*3/uL (ref 150–400)
RBC: 4.41 MIL/uL (ref 4.22–5.81)
RDW: 14.3 % (ref 11.5–15.5)
WBC: 8.7 10*3/uL (ref 4.0–10.5)
nRBC: 0 % (ref 0.0–0.2)

## 2019-06-11 LAB — BASIC METABOLIC PANEL
Anion gap: 8 (ref 5–15)
BUN: 15 mg/dL (ref 6–20)
CO2: 26 mmol/L (ref 22–32)
Calcium: 8.6 mg/dL — ABNORMAL LOW (ref 8.9–10.3)
Chloride: 104 mmol/L (ref 98–111)
Creatinine, Ser: 0.84 mg/dL (ref 0.61–1.24)
GFR calc Af Amer: >60 mL/min (ref >60)
GFR calc non Af Amer: >60 mL/min (ref >60)
Glucose, Bld: 107 mg/dL — ABNORMAL HIGH (ref 70–99)
Potassium: 3.9 mmol/L (ref 3.5–5.1)
Sodium: 138 mmol/L (ref 135–145)

## 2019-06-11 MED ORDER — CEPHALEXIN 500 MG PO CAPS: 500.0000 mg | ORAL_CAPSULE | Freq: Four times a day (QID) | ORAL | 0 refills | Status: AC

## 2019-06-11 MED ORDER — AMLODIPINE BESYLATE 2.5 MG PO TABS: 2.5000 mg | ORAL_TABLET | Freq: Every day | ORAL | 0 refills | Status: AC

## 2019-06-11 NOTE — Discharge Summary (Signed)
Physician Discharge Summary  Spencer Baker WHQ:[id number] DOB: 11-07-1990 DOA: 06/06/2019  PCP: Shawnie Dapper, PA-C  Admit date: 06/06/2019  Discharge date: 06/11/2019  Admitted From:Home  Disposition:  Home  Recommendations for Outpatient Follow-up:  1. Follow up with PCP in 1-2 weeks 2. Continue on Keflex as prescribed for 9 more days to complete a total 14-day course of treatment 3. Continue on amlodipine 2.5 mg daily and follow-up blood pressure readings 4. Follow-up with urology should symptoms worsen or persist  Home Health: None  Equipment/Devices: None  Discharge Condition: Stable  CODE STATUS: Full  Diet recommendation: Heart Healthy  Brief/Interim Summary: As per H&P written by Dr. Catha Gosselin on 06/07/2019 29 y.o.malewith a medical history ofhypertension, on no home medications, obesity, who presented to the emergency department with a "boil" on his right groin which she noticed approximately 3 days ago. It started as aningrown hair, which his wife pulled out and tried to squeeze the area. He noticed that there was some pus coming out. Patient noted increasing pain and swelling over the next 2 to 3 days. He states this has occurred in other areas before, such as armpits abdomen and other pubic areas. He states he has had no intervention in the past. Patient has not noted any nausea or vomiting, abdominal pain, chest pain, shortness of breath, change in bowel pattern, problems with urination,dizziness or headache. He has complained of fever at home and decreased appetite.  4/13: Patient is stable for discharge today and has been treated with a course of vancomycin and cefepime over the last 5 days. He has overall done quite well with no acute events or concerns otherwise noted. His leukocytosis has improved and he has had no fevers in the last 48 hours. He has also been seen by general surgery with no loculated infection or source of abscess noted. He is not needing any  intervention at this point in time and may be transitioned to oral antibiotics with Keflex to continue for 9 more days for total 14-day course. He may return to work as tolerated as well. Additionally, for his blood pressure he has been started on amlodipine with good improvement in blood pressure control which will need to be followed up outpatient.  Discharge Diagnoses:  Active Problems:   OBESITY, NOS   HYPERTENSION, BENIGN SYSTEMIC   Sepsis due to cellulitis (HCC)   Hidradenitis suppurativa  Principal discharge diagnosis: Sepsis secondary to cellulitis in the setting of hidradenitis.  Discharge Instructions   Allergies as of 06/11/2019   No Known Allergies     Medication List    TAKE these medications   amLODipine 2.5 MG tablet Commonly known as: NORVASC Take 1 tablet (2.5 mg total) by mouth daily.   cephALEXin 500 MG capsule Commonly known as: KEFLEX Take 1 capsule (500 mg total) by mouth 4 (four) times daily for 9 days.      Follow-up Information    Shawnie Dapper, PA-C Follow up in 2 week(s).   Specialties: Physician Assistant, Internal Medicine Contact information: [address] [place] [address] [phone]          No Known Allergies  Consultations:  General surgery   Procedures/Studies: CT PELVIS W CONTRAST  Result Date: 06/09/2019 CLINICAL DATA:  Abdominal abscess/infection is suspected. Worsening groin pain. Known cellulitis. EXAM: CT PELVIS WITH CONTRAST TECHNIQUE: Multidetector CT imaging of the pelvis was performed using the standard protocol following the bolus administration of intravenous contrast. CONTRAST:  OMNIPAQUE IOHEXOL  300 MG/ML  SOLN COMPARISON:  06/07/2019 FINDINGS: Urinary Tract:  No acute findings. Bowel:  No acute findings.  Appendix not visualized. Vascular/Lymphatic: Unremarkable. Reproductive:  Prostate gland is normal. Other: Continued evidence of moderate stranding of the subcutaneous fat over the  anterior pelvis in the midline and right of midline extending towards the right inguinal region. There is no focal fluid collection to suggest abscess. No air in the soft tissues. This is compatible patient's known cellulitis. This extends inferiorly to abut the anterior penile shaft. These findings are slightly more pronounced compared to the recent prior exam. Musculoskeletal: Unremarkable. IMPRESSION: Continued evidence of cellulitis over the subcutaneous fat of the anterior pelvis in the midline and right of midline extending towards the right inguinal region as well as inferiorly to abut the anterior penile shaft. Findings are slightly worse, although no focal fluid collection to suggest abscess. No air in the soft tissues. Electronically Signed   By: Elberta Fortisaniel  Boyle M.D.   On: 06/09/2019 14:30   CT PELVIS W CONTRAST  Result Date: 06/07/2019 CLINICAL DATA:  Right groin abscess. EXAM: CT PELVIS WITH CONTRAST TECHNIQUE: Multidetector CT imaging of the pelvis was performed using the standard protocol following the bolus administration of intravenous contrast. CONTRAST:  100mL OMNIPAQUE IOHEXOL 300 MG/ML  SOLN COMPARISON:  None. FINDINGS: Urinary Tract:  No abnormality visualized. Bowel:  Unremarkable visualized pelvic bowel loops. Vascular/Lymphatic: No pathologically enlarged lymph nodes. No significant vascular abnormality seen. Reproductive:  No mass or other significant abnormality Other: Marked severity anterior, diffuse subcutaneous inflammatory fat stranding is seen along the midline of the lower pelvic wall. This is most prominent within the region above the penile shaft, right greater than left. Mild extension along the anterior aspect of the right groin is seen. There is mild associated cutaneous thickening. No associated fluid collection or abscess is identified. Mild bilateral inguinal lymphadenopathy is seen. Musculoskeletal: No suspicious bone lesions identified. IMPRESSION: 1. Marked severity  cellulitis along the anterior aspect of the lower pelvic wall, within the region above the penile shaft. 2. No associated fluid collection or abscess. Electronically Signed   By: Aram Candelahaddeus  Houston M.D.   On: 06/07/2019 01:00     Discharge Exam: Vitals:   06/10/19 2109 06/11/19 0511  BP: (!) 151/94 (!) 148/77  Pulse: 66 (!) 59  Resp:  20  Temp:  98.3 F (36.8 C)  SpO2: 99% 98%   Vitals:   06/10/19 1427 06/10/19 2106 06/10/19 2109 06/11/19 0511  BP: 138/80 (!) 155/93 (!) 151/94 (!) 148/77  Pulse: 62 65 66 (!) 59  Resp: 20 20  20   Temp: 98.4 F (36.9 C) 98.9 F (37.2 C)  98.3 F (36.8 C)  TempSrc:  Oral  Oral  SpO2: 100% 100% 99% 98%  Weight:      Height:        General: Pt is alert, awake, not in acute distress, obese Cardiovascular: RRR, S1/S2 +, no rubs, no gallops Respiratory: CTA bilaterally, no wheezing, no rhonchi Abdominal: Soft, NT, ND, bowel sounds + Extremities: no edema, no cyanosis Skin changes of hydradenitis with no further erythema, tenderness, or warmth    The results of significant diagnostics from this hospitalization (including imaging, microbiology, ancillary and laboratory) are listed below for reference.     Microbiology: Recent Results (from the past 240 hour(s))  Blood culture (routine x 2)     Status: None (Preliminary result)   Collection Time: 06/06/19 10:22 PM   Specimen: BLOOD LEFT FOREARM  Result Value  Ref Range Status   Specimen Description BLOOD LEFT FOREARM  Final   Special Requests   Final    BOTTLES DRAWN AEROBIC AND ANAEROBIC Blood Culture adequate volume   Culture   Final    NO GROWTH 4 DAYS Performed at [facility], [address], [place], [place] [address]    Report Status PENDING  Incomplete  Blood culture (routine x 2)     Status: None (Preliminary result)   Collection Time: 06/06/19 11:14 PM   Specimen: BLOOD  Result Value Ref Range Status   Specimen Description BLOOD LEFT ANTECUBITAL  Final   Special Requests    Final    BOTTLES DRAWN AEROBIC AND ANAEROBIC Blood Culture adequate volume   Culture   Final    NO GROWTH 4 DAYS Performed at [facility], [address], [place], [place] [address]    Report Status PENDING  Incomplete  Urine culture     Status: None   Collection Time: 06/07/19 12:50 AM   Specimen: In/Out Cath Urine  Result Value Ref Range Status   Specimen Description   Final    IN/OUT CATH URINE Performed at [facility], [address] [place], [place] [address]    Special Requests   Final    NONE Performed at [facility], [address], [place], [place] [address]    Culture   Final    NO GROWTH Performed at [facility], [address] [place], [place] [address]    Report Status 06/08/2019 FINAL  Final  Respiratory Panel by RT PCR (Flu A&B, Covid) - Nasopharyngeal Swab     Status: None   Collection Time: 06/07/19  1:47 AM   Specimen: Nasopharyngeal Swab  Result Value Ref Range Status   SARS Coronavirus 2 by RT PCR NEGATIVE NEGATIVE Final    Comment: (NOTE) SARS-CoV-2 target nucleic acids are NOT DETECTED. The SARS-CoV-2 RNA is generally detectable in upper respiratoy specimens during the acute phase of infection. The lowest concentration of SARS-CoV-2 viral copies this assay can detect is 131 copies/mL. A negative result does not preclude SARS-Cov-2 infection and should not be used as the sole basis for treatment or other patient management decisions. A negative result may occur with  improper specimen collection/handling, submission of specimen other than nasopharyngeal swab, presence of viral mutation(s) within the areas targeted by this assay, and inadequate number of viral copies (<131 copies/mL). A negative result must be combined with clinical observations, patient history, and epidemiological information. The expected result is Negative. Fact Sheet for Patients:  https://www.moore.com/ Fact Sheet for Healthcare Providers:   https://www.young.biz/ This test is not yet ap proved or cleared by the [place] FDA and  has been authorized for detection and/or diagnosis of SARS-CoV-2 by FDA under an Emergency Use Authorization (EUA). This EUA will remain  in effect (meaning this test can be used) for the duration of the COVID-19 declaration under Section 564(b)(1) of the Act, 21 U.S.C. section 360bbb-3(b)(1), unless the authorization is terminated or revoked sooner.    Influenza A by PCR NEGATIVE NEGATIVE Final   Influenza B by PCR NEGATIVE NEGATIVE Final    Comment: (NOTE) The Xpert Xpress SARS-CoV-2/FLU/RSV assay is intended as an aid in  the diagnosis of influenza from Nasopharyngeal swab specimens and  should not be used as a sole basis for treatment. Nasal washings and  aspirates are unacceptable for Xpert Xpress SARS-CoV-2/FLU/RSV  testing. Fact Sheet for Patients: https://www.moore.com/ Fact Sheet for Healthcare Providers: https://www.young.biz/ This test  is not yet approved or cleared by the [place] and  has been authorized for detection and/or diagnosis of SARS-CoV-2 by  FDA under an Emergency Use Authorization (EUA). This EUA will remain  in effect (meaning this test can be used) for the duration of the  Covid-19 declaration under Section 564(b)(1) of the Act, 21  U.S.C. section 360bbb-3(b)(1), unless the authorization is  terminated or revoked. Performed at [facility], [address], [place], [place] [address]   MRSA PCR Screening     Status: None   Collection Time: 06/10/19 12:38 PM   Specimen: Nasal Mucosa; Nasopharyngeal  Result Value Ref Range Status   MRSA by PCR NEGATIVE NEGATIVE Final    Comment:        The GeneXpert MRSA Assay (FDA approved for NASAL specimens only), is one component of a comprehensive MRSA colonization surveillance program. It is not intended to diagnose MRSA infection nor to guide  or monitor treatment for MRSA infections. Performed at [facility], [address] [place], [place] [address]      Labs: BNP (last 3 results) No results for input(s): BNP in the last 8760 hours. Basic Metabolic Panel: Recent Labs  Lab 06/06/19 2222 06/06/19 2222 06/07/19 0748 06/08/19 0603 06/09/19 0558 06/10/19 0433 06/11/19 0419  NA 135  --   --  137 137 139 138  K 2.9*  --   --  3.6 3.2* 3.6 3.9  CL 103  --   --  103 105 106 104  CO2 23  --   --  GLUCOSE 118*  --   --  103* 135* 106* 107*  BUN 17  --   --  CREATININE 1.11   < > 0.98 0.89 0.94 0.80 0.84  CALCIUM 8.8*  --   --  8.8* 8.4* 8.4* 8.6*  MG  --   --  1.9  --  2.0  --   --    < > = values in this interval not displayed.   Liver Function Tests: Recent Labs  Lab 06/08/19 0603  AST 24  ALT 29  ALKPHOS 60  BILITOT 0.8  PROT 7.1  ALBUMIN 3.4*   No results for input(s): LIPASE, AMYLASE in the last 168 hours. No results for input(s): AMMONIA in the last 168 hours. CBC: Recent Labs  Lab 06/06/19 2222 06/07/19 0748 06/08/19 0603 06/09/19 0558 06/11/19 0419  WBC 19.2* 17.9* 19.3* 14.9* 8.7  NEUTROABS 14.7*  --   --   --  6.1  HGB 14.3 13.3 13.5 13.1 12.6*  HCT 42.9 40.7 41.2 39.9 39.2  MCV 85.6 88.5 88.0 88.5 88.9  PLT 238 208 204 221 266   Cardiac Enzymes: No results for input(s): CKTOTAL, CKMB, CKMBINDEX, TROPONINI in the last 168 hours. BNP: Invalid input(s): POCBNP CBG: No results for input(s): GLUCAP in the last 168 hours. D-Dimer No results for input(s): DDIMER in the last 72 hours. Hgb A1c No results for input(s): HGBA1C in the last 72 hours. Lipid Profile No results for input(s): CHOL, HDL, LDLCALC, TRIG, CHOLHDL, LDLDIRECT in the last 72 hours. Thyroid function studies No results for input(s): TSH, T4TOTAL, T3FREE, THYROIDAB in the last 72 hours.  Invalid input(s): FREET3 Anemia work up No results for input(s): VITAMINB12, FOLATE, FERRITIN, TIBC, IRON,  RETICCTPCT in the last 72 hours. Urinalysis    Component Value Date/Time   COLORURINE YELLOW 06/07/2019 0050   APPEARANCEUR CLEAR 06/07/2019 0050  LABSPEC 1.032 (H) 06/07/2019 0050   PHURINE 5.0 06/07/2019 0050   GLUCOSEU NEGATIVE 06/07/2019 0050   HGBUR LARGE (A) 06/07/2019 0050   BILIRUBINUR NEGATIVE 06/07/2019 0050   KETONESUR NEGATIVE 06/07/2019 0050   PROTEINUR NEGATIVE 06/07/2019 0050   NITRITE NEGATIVE 06/07/2019 0050   LEUKOCYTESUR NEGATIVE 06/07/2019 0050   Sepsis Labs Invalid input(s): PROCALCITONIN,  WBC,  LACTICIDVEN Microbiology Recent Results (from the past 240 hour(s))  Blood culture (routine x 2)     Status: None (Preliminary result)   Collection Time: 06/06/19 10:22 PM   Specimen: BLOOD LEFT FOREARM  Result Value Ref Range Status   Specimen Description BLOOD LEFT FOREARM  Final   Special Requests   Final    BOTTLES DRAWN AEROBIC AND ANAEROBIC Blood Culture adequate volume   Culture   Final    NO GROWTH 4 DAYS Performed at [facility], [address], [place], [place] [address]    Report Status PENDING  Incomplete  Blood culture (routine x 2)     Status: None (Preliminary result)   Collection Time: 06/06/19 11:14 PM   Specimen: BLOOD  Result Value Ref Range Status   Specimen Description BLOOD LEFT ANTECUBITAL  Final   Special Requests   Final    BOTTLES DRAWN AEROBIC AND ANAEROBIC Blood Culture adequate volume   Culture   Final    NO GROWTH 4 DAYS Performed at [facility], [address] [place], [place] [address]    Report Status PENDING  Incomplete  Urine culture     Status: None   Collection Time: 06/07/19 12:50 AM   Specimen: In/Out Cath Urine  Result Value Ref Range Status   Specimen Description   Final    IN/OUT CATH URINE Performed at [facility], [address] [place], [place] [address]    Special Requests   Final    NONE Performed at [facility], [address], [place], [place] [address]    Culture   Final    NO GROWTH Performed  at [facility], [place] [address], [place], [place] [address]    Report Status 06/08/2019 FINAL  Final  Respiratory Panel by RT PCR (Flu A&B, Covid) - Nasopharyngeal Swab     Status: None   Collection Time: 06/07/19  1:47 AM   Specimen: Nasopharyngeal Swab  Result Value Ref Range Status   SARS Coronavirus 2 by RT PCR NEGATIVE NEGATIVE Final    Comment: (NOTE) SARS-CoV-2 target nucleic acids are NOT DETECTED. The SARS-CoV-2 RNA is generally detectable in upper respiratoy specimens during the acute phase of infection. The lowest concentration of SARS-CoV-2 viral copies this assay can detect is 131 copies/mL. A negative result does not preclude SARS-Cov-2 infection and should not be used as the sole basis for treatment or other patient management decisions. A negative result may occur with  improper specimen collection/handling, submission of specimen other than nasopharyngeal swab, presence of viral mutation(s) within the areas targeted by this assay, and inadequate number of viral copies (<131 copies/mL). A negative result must be combined with clinical observations, patient history, and epidemiological information. The expected result is Negative. Fact Sheet for Patients:  PinkCheek.be Fact Sheet for Healthcare Providers:  GravelBags.it This test is not yet ap proved or cleared by the [place] FDA and  has been authorized for detection and/or diagnosis of SARS-CoV-2 by FDA under an Emergency Use Authorization (EUA). This EUA will remain  in effect (meaning this test can be used) for the duration of the  COVID-19 declaration under Section 564(b)(1) of the Act, 21 U.S.C. section 360bbb-3(b)(1), unless the authorization is terminated or revoked sooner.    Influenza A by PCR NEGATIVE NEGATIVE Final   Influenza B by PCR NEGATIVE NEGATIVE Final    Comment: (NOTE) The Xpert Xpress SARS-CoV-2/FLU/RSV assay is intended as an  aid in  the diagnosis of influenza from Nasopharyngeal swab specimens and  should not be used as a sole basis for treatment. Nasal washings and  aspirates are unacceptable for Xpert Xpress SARS-CoV-2/FLU/RSV  testing. Fact Sheet for Patients: https://www.moore.com/ Fact Sheet for Healthcare Providers: https://www.young.biz/ This test is not yet approved or cleared by the [place] FDA and  has been authorized for detection and/or diagnosis of SARS-CoV-2 by  FDA under an Emergency Use Authorization (EUA). This EUA will remain  in effect (meaning this test can be used) for the duration of the  Covid-19 declaration under Section 564(b)(1) of the Act, 21  U.S.C. section 360bbb-3(b)(1), unless the authorization is  terminated or revoked. Performed at [facility], [address], [place], [place] [address]   MRSA PCR Screening     Status: None   Collection Time: 06/10/19 12:38 PM   Specimen: Nasal Mucosa; Nasopharyngeal  Result Value Ref Range Status   MRSA by PCR NEGATIVE NEGATIVE Final    Comment:        The GeneXpert MRSA Assay (FDA approved for NASAL specimens only), is one component of a comprehensive MRSA colonization surveillance program. It is not intended to diagnose MRSA infection nor to guide or monitor treatment for MRSA infections. Performed at [facility], [address], [place], [place] [address]      Time coordinating discharge: 35 minutes  SIGNED:   Erick Blinks, DO [facility] 06/11/2019, 9:26 AM  If 7PM-7AM, please contact night-coverage [email or web address]

## 2019-06-11 NOTE — Plan of Care (Signed)
  Problem: Education: Goal: Knowledge of General Education information will improve Description: Including pain rating scale, medication(s)/side effects and non-pharmacologic comfort measures Outcome: Adequate for Discharge   Problem: Health Behavior/Discharge Planning: Goal: Ability to manage health-related needs will improve Outcome: Adequate for Discharge   Problem: Clinical Measurements: Goal: Ability to maintain clinical measurements within normal limits will improve Outcome: Adequate for Discharge Goal: Will remain free from infection Outcome: Adequate for Discharge Goal: Diagnostic test results will improve Outcome: Adequate for Discharge Goal: Respiratory complications will improve Outcome: Adequate for Discharge Goal: Cardiovascular complication will be avoided Outcome: Adequate for Discharge   Problem: Activity: Goal: Risk for activity intolerance will decrease Outcome: Adequate for Discharge   Problem: Nutrition: Goal: Adequate nutrition will be maintained Outcome: Adequate for Discharge   Problem: Coping: Goal: Level of anxiety will decrease Outcome: Adequate for Discharge   Problem: Elimination: Goal: Will not experience complications related to bowel motility Outcome: Adequate for Discharge Goal: Will not experience complications related to urinary retention Outcome: Adequate for Discharge   Problem: Pain Managment: Goal: General experience of comfort will improve Outcome: Adequate for Discharge   Problem: Safety: Goal: Ability to remain free from injury will improve Outcome: Adequate for Discharge   Problem: Skin Integrity: Goal: Risk for impaired skin integrity will decrease Outcome: Adequate for Discharge   Problem: Education: Goal: Knowledge of General Education information will improve Description: Including pain rating scale, medication(s)/side effects and non-pharmacologic comfort measures Outcome: Adequate for Discharge   Problem: Health  Behavior/Discharge Planning: Goal: Ability to manage health-related needs will improve Outcome: Adequate for Discharge   Problem: Clinical Measurements: Goal: Ability to maintain clinical measurements within normal limits will improve Outcome: Adequate for Discharge Goal: Will remain free from infection Outcome: Adequate for Discharge Goal: Diagnostic test results will improve Outcome: Adequate for Discharge Goal: Respiratory complications will improve Outcome: Adequate for Discharge Goal: Cardiovascular complication will be avoided Outcome: Adequate for Discharge   Problem: Activity: Goal: Risk for activity intolerance will decrease Outcome: Adequate for Discharge   Problem: Nutrition: Goal: Adequate nutrition will be maintained Outcome: Adequate for Discharge   Problem: Coping: Goal: Level of anxiety will decrease Outcome: Adequate for Discharge   Problem: Elimination: Goal: Will not experience complications related to bowel motility Outcome: Adequate for Discharge Goal: Will not experience complications related to urinary retention Outcome: Adequate for Discharge   Problem: Pain Managment: Goal: General experience of comfort will improve Outcome: Adequate for Discharge   Problem: Safety: Goal: Ability to remain free from injury will improve Outcome: Adequate for Discharge   Problem: Skin Integrity: Goal: Risk for impaired skin integrity will decrease Outcome: Adequate for Discharge   Problem: Clinical Measurements: Goal: Ability to avoid or minimize complications of infection will improve Outcome: Adequate for Discharge   Problem: Skin Integrity: Goal: Skin integrity will improve Outcome: Adequate for Discharge

## 2019-06-12 LAB — CULTURE, BLOOD (ROUTINE X 2)
Culture: NO GROWTH
Culture: NO GROWTH
Special Requests: ADEQUATE
Special Requests: ADEQUATE

## 2019-06-29 ENCOUNTER — Ambulatory Visit (INDEPENDENT_AMBULATORY_CARE_PROVIDER_SITE_OTHER): Payer: 59

## 2019-06-29 ENCOUNTER — Other Ambulatory Visit: Payer: Self-pay

## 2019-06-29 ENCOUNTER — Ambulatory Visit
Admission: EM | Admit: 2019-06-29 | Discharge: 2019-06-29 | Disposition: A | Discharge status: Home / Self Care | Payer: 59 | Attending: Emergency Medicine | Admitting: Emergency Medicine

## 2019-06-29 DIAGNOSIS — M7989 Other specified soft tissue disorders: Secondary | ICD-10-CM | POA: Diagnosis not present

## 2019-06-29 DIAGNOSIS — M79672 Pain in left foot: Secondary | ICD-10-CM | POA: Diagnosis not present

## 2019-06-29 DIAGNOSIS — M25572 Pain in left ankle and joints of left foot: Secondary | ICD-10-CM

## 2019-06-29 MED ORDER — KETOROLAC TROMETHAMINE 60 MG/2ML IM SOLN
60.0000 mg | Freq: Once | INTRAMUSCULAR | Status: AC
  Administered 2019-06-29: 11:00:00 60 mg via INTRAMUSCULAR

## 2019-06-29 MED ORDER — PREDNISONE 20 MG PO TABS: 20.0000 mg | ORAL_TABLET | Freq: Two times a day (BID) | ORAL | 0 refills | Status: AC

## 2019-06-29 MED ORDER — DEXAMETHASONE SODIUM PHOSPHATE 10 MG/ML IJ SOLN
10.0000 mg | Freq: Once | INTRAMUSCULAR | Status: AC
  Administered 2019-06-29: 11:00:00 10 mg via INTRAMUSCULAR

## 2019-06-29 NOTE — ED Triage Notes (Signed)
Pt presents with swelling in medial side of left foot. Discoloration noted . Denies injury

## 2019-06-29 NOTE — Discharge Instructions (Signed)
X-rays without any obvious findings, pending radiologist interpretation.  I will call you with abnormal results Steroid and toradol shot given in office Continue conservative management of rest, ice, and elevate Prednisone prescribed.  Take as directed and to completion Follow up with PCP if symptoms persist Return or go to the ER if you have any new or worsening symptoms (fever, chills, chest pain, shortness of breath, calf pain, swelling, redness, bruising, worsening symptoms despite treatment, etc...)

## 2019-06-29 NOTE — ED Provider Notes (Signed)
[place]   [id number] 06/29/19 Arrival Time: 9702  CC:LT foot  SUBJECTIVE: History from: patient. PRATYUSH AMMON is a 29 y.o. male complains of left foot pain and swelling x 1 day.  Denies a precipitating event or specific injury.  States he was working when he first noticed symptoms.  Localizes the pain to the top of LT foot.  Describes the pain as intermittent and throbbing in character.  Has tried icing with minimal relief.  Symptoms are made worse with weight-bearing.  Denies similar symptoms in the past.  Denies fever, chills, erythema, ecchymosis, weakness, numbness and tingling.   ROS: As per HPI.  All other pertinent ROS negative.     Past Medical History:  Diagnosis Date  . Hypertension    History reviewed. No pertinent surgical history. No Known Allergies No current facility-administered medications on file prior to encounter.   Current Outpatient Medications on File Prior to Encounter  Medication Sig Dispense Refill  . amLODipine (NORVASC) 2.5 MG tablet Take 1 tablet (2.5 mg total) by mouth daily. 30 tablet 0   Social History   Socioeconomic History  . Marital status: Married    Spouse name: Not on file  . Number of children: Not on file  . Years of education: Not on file  . Highest education level: Not on file  Occupational History  . Not on file  Tobacco Use  . Smoking status: Current Every Day Smoker    Packs/day: 0.50    Types: Cigarettes  . Smokeless tobacco: Never Used  Substance and Sexual Activity  . Alcohol use: No  . Drug use: No  . Sexual activity: Yes  Other Topics Concern  . Not on file  Social History Narrative  . Not on file   Social Determinants of Health   Financial Resource Strain:   . Difficulty of Paying Living Expenses:   Food Insecurity:   . Worried About Charity fundraiser in the Last Year:   . Arboriculturist in the Last Year:   Transportation Needs:   . Film/video editor (Medical):   Marland Kitchen Lack of  Transportation (Non-Medical):   Physical Activity:   . Days of Exercise per Week:   . Minutes of Exercise per Session:   Stress:   . Feeling of Stress :   Social Connections:   . Frequency of Communication with Friends and Family:   . Frequency of Social Gatherings with Friends and Family:   . Attends Religious Services:   . Active Member of Clubs or Organizations:   . Attends Archivist Meetings:   Marland Kitchen Marital Status:   Intimate Partner Violence:   . Fear of Current or Ex-Partner:   . Emotionally Abused:   Marland Kitchen Physically Abused:   . Sexually Abused:    History reviewed. No pertinent family history.  OBJECTIVE:  Vitals:   06/29/19 1002  BP: (!) 160/113  Pulse: 97  Resp: 20  Temp: 98.3 F (36.8 C)  SpO2: 96%    General appearance: ALERT; in no acute distress.  Head: NCAT Lungs: Normal respiratory effort CV: Dorsalis pedis pulse 2+ Musculoskeletal: LT ankle Inspection: Swelling over medial proximal aspect of LT foot Palpation: diffusely TTP over anterior ankle, and dorsal proximal aspect of foot ROM: FROM active and passive Strength: 5/5 dorsiflexion, 4/5 plantar flexion Stability: Anterior/ posterior drawer intact Skin: warm and dry Neurologic: Ambulates with antalgic gait; Sensation intact about the lower extremities Psychological: alert and cooperative; normal mood and  affect  DIAGNOSTIC STUDIES:  DG Foot Complete Left  Result Date: 06/29/2019 CLINICAL DATA:  Dorsal left foot pain and swelling. No reported injury. EXAM: LEFT FOOT - COMPLETE 3+ VIEW COMPARISON:  None. FINDINGS: Mild dorsal left foot soft tissue swelling. No fracture or dislocation. No suspicious focal osseous lesions. No significant degenerative or erosive arthropathy. No radiopaque foreign bodies. IMPRESSION: Mild dorsal left foot soft tissue swelling. No acute osseous abnormality. Electronically Signed   By: Delbert Phenix M.D.   On: 06/29/2019 10:53     X-rays negative for bony abnormalities  including fracture, or dislocation.  No soft tissue swelling.    I have reviewed the x-rays myself and the radiologist interpretation. I am in agreement with the radiologist interpretation.     ASSESSMENT & PLAN:  1. Acute left ankle pain   2. Left foot pain     Meds ordered this encounter  Medications  . predniSONE (DELTASONE) 20 MG tablet    Sig: Take 1 tablet (20 mg total) by mouth 2 (two) times daily with a meal for 5 days.    Dispense:  10 tablet    Refill:  0    Order Specific Question:   Supervising Provider    Answer:   Eustace Moore [[id number]]  . ketorolac (TORADOL) injection 60 mg  . dexamethasone (DECADRON) injection 10 mg   Discussed possibility of complication from recent hospitalization.  Offered further evaluation and management in the ED to rule out blood clot for instance.  Declines at this time and would like to try outpatient therapy at this time.    X-rays without any obvious findings, pending radiologist interpretation.  I will call you with abnormal results Steroid and toradol shot given in office Continue conservative management of rest, ice, and elevate Prednisone prescribed.  Take as directed and to completion Follow up with PCP if symptoms persist Return or go to the ER if you have any new or worsening symptoms (fever, chills, chest pain, shortness of breath, calf pain, swelling, redness, bruising, worsening symptoms despite treatment, etc...)   Reviewed expectations re: course of current medical issues. Questions answered. Outlined signs and symptoms indicating need for more acute intervention. Patient verbalized understanding. After Visit Summary given.    Rennis Harding, PA-C 06/29/19 1058

## 2020-06-12 ENCOUNTER — Encounter: Payer: Self-pay | Admitting: Emergency Medicine

## 2020-06-12 ENCOUNTER — Ambulatory Visit
Admission: EM | Admit: 2020-06-12 | Discharge: 2020-06-12 | Disposition: A | Discharge status: Home / Self Care | Payer: BC Managed Care – PPO | Attending: Emergency Medicine | Admitting: Emergency Medicine

## 2020-06-12 ENCOUNTER — Other Ambulatory Visit: Payer: Self-pay

## 2020-06-12 DIAGNOSIS — R030 Elevated blood-pressure reading, without diagnosis of hypertension: Secondary | ICD-10-CM

## 2020-06-12 DIAGNOSIS — M109 Gout, unspecified: Secondary | ICD-10-CM | POA: Diagnosis not present

## 2020-06-12 MED ORDER — CLONIDINE HCL 0.1 MG PO TABS
0.1000 mg | ORAL_TABLET | Freq: Once | ORAL | Status: AC
  Administered 2020-06-12: 0.1 mg via ORAL

## 2020-06-12 MED ORDER — PREDNISONE 20 MG PO TABS: 20.0000 mg | ORAL_TABLET | Freq: Two times a day (BID) | ORAL | 0 refills | Status: AC

## 2020-06-12 NOTE — Discharge Instructions (Signed)
Continue conservative management of rest, ice, and gentle stretches Prednisone prescribed.  Take as directed and to completion Follow up with PCP if symptoms persist Return or go to the ER if you have any new or worsening symptoms (fever, chills, chest pain, increased redness, swelling, bruising, deformity, etc...)   Blood pressure elevated in office 191/139, then 190/125 for recheck.  Was 116/113 apx 11 months ago.  Clonidine given in office.  Check blood pressure when you get home and begin keeping a log.  Please follow up with PCP for further evaluation and management.

## 2020-06-12 NOTE — ED Provider Notes (Signed)
[facility]   [id number] 06/12/20 Arrival Time: 1328  CC: foot PAIN  SUBJECTIVE: History from: patient. Spencer Baker is a 30 y.o. male complains of LT foot pain x 1 day.  Denies a precipitating event or specific injury.  Localizes the pain to the LT great toe.  Describes the pain as intermittent and sharp in character.  Has tried OTC medications without relief.  Symptoms are made worse with weight-bearing.  Denies similar symptoms in the past.  Complains of redness, and swelling.  Denies fever, chills, ecchymosis, weakness.  Incidentally blood pressure elevated in office.  191/139 and then rechecked 190/125.  Patient has PCP.  Has been on norvasc in the past.  Denies chest pain, shortness of breath, slurred speech, facial droop, weakness, headache, vision changes  ROS: As per HPI.  All other pertinent ROS negative.     Past Medical History:  Diagnosis Date  . Hypertension    History reviewed. No pertinent surgical history. No Known Allergies No current facility-administered medications on file prior to encounter.   Current Outpatient Medications on File Prior to Encounter  Medication Sig Dispense Refill  . amLODipine (NORVASC) 2.5 MG tablet Take 1 tablet (2.5 mg total) by mouth daily. 30 tablet 0   Social History   Socioeconomic History  . Marital status: Married    Spouse name: Not on file  . Number of children: Not on file  . Years of education: Not on file  . Highest education level: Not on file  Occupational History  . Not on file  Tobacco Use  . Smoking status: Current Every Day Smoker    Packs/day: 0.50    Types: Cigarettes  . Smokeless tobacco: Never Used  Vaping Use  . Vaping Use: Never used  Substance and Sexual Activity  . Alcohol use: No  . Drug use: No  . Sexual activity: Yes  Other Topics Concern  . Not on file  Social History Narrative  . Not on file   Social Determinants of Health   Financial Resource Strain: Not on file  Food  Insecurity: Not on file  Transportation Needs: Not on file  Physical Activity: Not on file  Stress: Not on file  Social Connections: Not on file  Intimate Partner Violence: Not on file   No family history on file.  OBJECTIVE:  Vitals:   06/12/20 1443 06/12/20 1444 06/12/20 1515  BP:  (!) 191/139 (!) 190/125  Pulse: 69    Resp: 16    Temp: 98.2 F (36.8 C)    TempSrc: Oral    SpO2: 95%      General appearance: ALERT; in no acute distress.  Head: NCAT Lungs: Normal respiratory effort CV: Dorsalis pedis pulse 2+ Musculoskeletal: LT foot Inspection: erythema over first MTP joint Palpation: exquisitely TTP Skin: warm and dry Neurologic: Ambulates without difficulty; Sensation intact about the upper/ lower extremities Psychological: alert and cooperative; normal mood and affect   ASSESSMENT & PLAN:  1. Acute gout of left foot, unspecified cause   2. Elevated blood pressure reading     @NIMG @  Meds ordered this encounter  Medications  . predniSONE (DELTASONE) 20 MG tablet    Sig: Take 1 tablet (20 mg total) by mouth 2 (two) times daily with a meal for 5 days.    Dispense:  10 tablet    Refill:  0    Order Specific Question:   Supervising Provider    Answer:   Eustace Moore  .  cloNIDine (CATAPRES) tablet 0.1 mg    Continue conservative management of rest, ice, and gentle stretches Prednisone prescribed.  Take as directed and to completion Follow up with PCP if symptoms persist Return or go to the ER if you have any new or worsening symptoms (fever, chills, chest pain, increased redness, swelling, bruising, deformity, etc...)   Blood pressure elevated in office 191/139, then 190/125 for recheck.  Was 116/113 apx 11 months ago.  Clonidine given in office.  Check blood pressure when you get home and begin keeping a log.  Please follow up with PCP for further evaluation and management.    Reviewed expectations re: course of current medical issues.  Questions answered. Outlined signs and symptoms indicating need for more acute intervention. Patient verbalized understanding. After Visit Summary given.    Rennis Harding, PA-C 06/12/20 1527

## 2020-06-12 NOTE — ED Triage Notes (Signed)
LT foot pain x 3 days, denies any injury.  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

## 2022-01-25 ENCOUNTER — Encounter: Payer: Self-pay | Admitting: Emergency Medicine

## 2022-01-25 ENCOUNTER — Ambulatory Visit
Admission: EM | Admit: 2022-01-25 | Discharge: 2022-01-25 | Disposition: A | Discharge status: Home / Self Care | Payer: BC Managed Care – PPO | Attending: Family Medicine | Admitting: Family Medicine

## 2022-01-25 ENCOUNTER — Other Ambulatory Visit: Payer: Self-pay

## 2022-01-25 DIAGNOSIS — R197 Diarrhea, unspecified: Secondary | ICD-10-CM

## 2022-01-25 DIAGNOSIS — R112 Nausea with vomiting, unspecified: Secondary | ICD-10-CM

## 2022-01-25 MED ORDER — ONDANSETRON 4 MG PO TBDP: 4.0000 mg | ORAL_TABLET | Freq: Three times a day (TID) | ORAL | 0 refills | Status: AC | PRN

## 2022-01-25 MED ORDER — ONDANSETRON 4 MG PO TBDP
4.0000 mg | ORAL_TABLET | Freq: Once | ORAL | Status: AC
  Administered 2022-01-25: 4 mg via ORAL

## 2022-01-25 NOTE — ED Triage Notes (Signed)
Pt reports intermittent emesis and constipation x2 days. Pt reports decreased appetite, abd pain, and diarrhea started last night. Has tried pepto bismol and midol.

## 2022-01-25 NOTE — ED Provider Notes (Signed)
[facility]    CSN: [id number] Arrival date & time: 01/25/22  1819      History   Chief Complaint Chief Complaint  Patient presents with   Emesis    HPI Spencer Baker is a 31 y.o. male.   Presenting today with 2 days 3 of nausea, vomiting, generalized abdominal bloating, mild constipation.  Denies cough, sore throat, chest pain, shortness of breath, severe abdominal pain, fever, body aches.  So far trying Pepto-Bismol and Midol with no relief.  Has not eaten anything all day but is tolerating fluids.    Past Medical History:  Diagnosis Date   Hypertension     Patient Active Problem List   Diagnosis Date Noted   Hidradenitis suppurativa    Sepsis due to cellulitis (HCC) 06/07/2019   OBESITY, NOS 04/27/2006   ATTENTION DEFICIT, W/HYPERACTIVITY 04/27/2006   HYPERTENSION, BENIGN SYSTEMIC 04/27/2006    History reviewed. No pertinent surgical history.     Home Medications    Prior to Admission medications   Medication Sig Start Date End Date Taking? Authorizing Provider  ondansetron (ZOFRAN-ODT) 4 MG disintegrating tablet Take 1 tablet (4 mg total) by mouth every 8 (eight) hours as needed for nausea or vomiting. 01/25/22  Yes Particia Nearing, PA-C  amLODipine (NORVASC) 2.5 MG tablet Take 1 tablet (2.5 mg total) by mouth daily. 06/11/19 07/11/19  Maurilio Lovely D, DO    Family History History reviewed. No pertinent family history.  Social History Social History   Tobacco Use   Smoking status: Every Day    Packs/day: 0.50    Types: Cigarettes   Smokeless tobacco: Never  Vaping Use   Vaping Use: Never used  Substance Use Topics   Alcohol use: No   Drug use: No     Allergies   Patient has no known allergies.   Review of Systems Review of Systems Or HPI  Physical Exam Triage Vital Signs ED Triage Vitals  Enc Vitals Group     BP 01/25/22 1923 (!) 148/93     Pulse Rate 01/25/22 1923 73     Resp 01/25/22 1923 20     Temp  01/25/22 1923 98.7 F (37.1 C)     Temp Source 01/25/22 1923 Oral     SpO2 01/25/22 1923 93 %     Weight --      Height --      Head Circumference --      Peak Flow --      Pain Score 01/25/22 1919 5     Pain Loc --      Pain Edu? --      Excl. in [place]? --    No data found.  Updated Vital Signs BP (!) 148/93 (BP Location: Right Arm)   Pulse 73   Temp 98.7 F (37.1 C) (Oral)   Resp 20   SpO2 93%   Visual Acuity Right Eye Distance:   Left Eye Distance:   Bilateral Distance:    Right Eye Near:   Left Eye Near:    Bilateral Near:     Physical Exam Vitals and nursing note reviewed.  Constitutional:      Appearance: Normal appearance.  HENT:     Head: Atraumatic.     Nose: Nose normal.     Mouth/Throat:     Mouth: Mucous membranes are moist.  Eyes:     Extraocular Movements: Extraocular movements intact.     Conjunctiva/sclera: Conjunctivae normal.  Cardiovascular:  Rate and Rhythm: Normal rate and regular rhythm.     Heart sounds: Normal heart sounds.  Pulmonary:     Effort: Pulmonary effort is normal.     Breath sounds: Normal breath sounds.  Abdominal:     General: Bowel sounds are normal. There is no distension.     Palpations: Abdomen is soft.     Tenderness: There is no abdominal tenderness. There is no guarding.  Musculoskeletal:        General: Normal range of motion.     Cervical back: Normal range of motion and neck supple.  Skin:    General: Skin is warm and dry.  Neurological:     General: No focal deficit present.     Mental Status: He is oriented to person, place, and time.  Psychiatric:        Mood and Affect: Mood normal.        Thought Content: Thought content normal.        Judgment: Judgment normal.      UC Treatments / Results  Labs (all labs ordered are listed, but only abnormal results are displayed) Labs Reviewed - No data to display  EKG   Radiology No results found.  Procedures Procedures (including critical care  time)  Medications Ordered in UC Medications  ondansetron (ZOFRAN-ODT) disintegrating tablet 4 mg (4 mg Oral Given 01/25/22 1949)    Initial Impression / Assessment and Plan / UC Course  I have reviewed the triage vital signs and the nursing notes.  Pertinent labs & imaging results that were available during my care of the patient were reviewed by me and considered in my medical decision making (see chart for details).     Vitals and exam overall reassuring today, suspect viral GI illness.  Treat with a dose of Zofran here and Zofran to the pharmacy for as needed use, brat diet, fluids.  Work note given.  Return for worsening symptoms.  Final Clinical Impressions(s) / UC Diagnoses   Final diagnoses:  Nausea vomiting and diarrhea   Discharge Instructions   None    ED Prescriptions     Medication Sig Dispense Auth. Provider   ondansetron (ZOFRAN-ODT) 4 MG disintegrating tablet Take 1 tablet (4 mg total) by mouth every 8 (eight) hours as needed for nausea or vomiting. 20 tablet Volney American, [place]      PDMP not reviewed this encounter.   Volney American, [place] 01/25/22 1952

## 2022-03-10 ENCOUNTER — Ambulatory Visit
Admission: EM | Admit: 2022-03-10 | Discharge: 2022-03-10 | Disposition: A | Discharge status: Home / Self Care | Payer: 59 | Attending: Physician Assistant | Admitting: Physician Assistant

## 2022-03-10 ENCOUNTER — Encounter: Payer: Self-pay | Admitting: Emergency Medicine

## 2022-03-10 DIAGNOSIS — Z711 Person with feared health complaint in whom no diagnosis is made: Secondary | ICD-10-CM | POA: Diagnosis not present

## 2022-03-10 NOTE — ED Triage Notes (Signed)
Wants to be tested for STD.

## 2022-03-10 NOTE — ED Provider Notes (Signed)
[facility]    CSN: [id number] Arrival date & time: 03/10/22  0851      History   Chief Complaint No chief complaint on file.   HPI Spencer Baker is a 32 y.o. male.   Patient request STD testing he states he does not have any symptoms he just wants to be tested.  Patient denies any known risk  The history is provided by the patient.    Past Medical History:  Diagnosis Date   Hypertension     Patient Active Problem List   Diagnosis Date Noted   Hidradenitis suppurativa    Sepsis due to cellulitis ([place]) 06/07/2019   OBESITY, NOS 04/27/2006   ATTENTION DEFICIT, W/HYPERACTIVITY 04/27/2006   HYPERTENSION, BENIGN SYSTEMIC 04/27/2006    History reviewed. No pertinent surgical history.     Home Medications    Prior to Admission medications   Medication Sig Start Date End Date Taking? Authorizing Provider  amLODipine (NORVASC) 2.5 MG tablet Take 1 tablet (2.5 mg total) by mouth daily. 06/11/19 07/11/19  Manuella Ghazi, Pratik D, DO  ondansetron (ZOFRAN-ODT) 4 MG disintegrating tablet Take 1 tablet (4 mg total) by mouth every 8 (eight) hours as needed for nausea or vomiting. 01/25/22   Volney American, PA-C    Family History History reviewed. No pertinent family history.  Social History Social History   Tobacco Use   Smoking status: Former    Packs/day: 0.50    Types: Cigarettes   Smokeless tobacco: Never  Vaping Use   Vaping Use: Never used  Substance Use Topics   Alcohol use: No   Drug use: No     Allergies   Patient has no known allergies.   Review of Systems Review of Systems  All other systems reviewed and are negative.    Physical Exam Triage Vital Signs ED Triage Vitals  Enc Vitals Group     BP 03/10/22 0907 (!) 190/121     Pulse Rate 03/10/22 0907 80     Resp 03/10/22 0907 18     Temp 03/10/22 0907 97.7 F (36.5 C)     Temp Source 03/10/22 0907 Oral     SpO2 03/10/22 0907 98 %     Weight --      Height --      Head  Circumference --      Peak Flow --      Pain Score 03/10/22 0908 0     Pain Loc --      Pain Edu? --      Excl. in [place]? --    No data found.  Updated Vital Signs BP (!) 190/121 (BP Location: Right Arm)   Pulse 80   Temp 97.7 F (36.5 C) (Oral)   Resp 18   SpO2 98%   Visual Acuity Right Eye Distance:   Left Eye Distance:   Bilateral Distance:    Right Eye Near:   Left Eye Near:    Bilateral Near:     Physical Exam Vitals and nursing note reviewed.  Constitutional:      Appearance: He is well-developed.  HENT:     Head: Normocephalic.  Pulmonary:     Effort: Pulmonary effort is normal.  Abdominal:     General: There is no distension.  Musculoskeletal:        General: Normal range of motion.     Cervical back: Normal range of motion.  Neurological:     Mental Status: He is alert and oriented  to person, place, and time.      UC Treatments / Results  Labs (all labs ordered are listed, but only abnormal results are displayed) Labs Reviewed  CYTOLOGY, (ORAL, ANAL, URETHRAL) ANCILLARY ONLY    EKG   Radiology No results found.  Procedures Procedures (including critical care time)  Medications Ordered in UC Medications - No data to display  Initial Impression / Assessment and Plan / UC Course  I have reviewed the triage vital signs and the nursing notes.  Pertinent labs & imaging results that were available during my care of the patient were reviewed by me and considered in my medical decision making (see chart for details).     STD test pending Final Clinical Impressions(s) / UC Diagnoses   Final diagnoses:  Concern about STD in male without diagnosis   Discharge Instructions   None    ED Prescriptions   None    PDMP not reviewed this encounter. An After Visit Summary was printed and given to the patient.       Fransico Meadow, [place] 03/10/22 2800

## 2022-03-11 ENCOUNTER — Telehealth (HOSPITAL_COMMUNITY): Payer: Self-pay | Admitting: Emergency Medicine

## 2022-03-11 LAB — CYTOLOGY, (ORAL, ANAL, URETHRAL) ANCILLARY ONLY
Chlamydia: NEGATIVE
Comment: NEGATIVE
Comment: NEGATIVE
Comment: NORMAL
Neisseria Gonorrhea: NEGATIVE
Trichomonas: POSITIVE — AB

## 2022-03-11 MED ORDER — METRONIDAZOLE 500 MG PO TABS: 2000.0000 mg | ORAL_TABLET | Freq: Once | ORAL | 0 refills | Status: AC

## 2022-03-19 ENCOUNTER — Ambulatory Visit
Admission: EM | Admit: 2022-03-19 | Discharge: 2022-03-19 | Disposition: A | Discharge status: Home / Self Care | Payer: 59 | Attending: Emergency Medicine | Admitting: Emergency Medicine

## 2022-03-19 ENCOUNTER — Encounter: Payer: Self-pay | Admitting: Emergency Medicine

## 2022-03-19 DIAGNOSIS — Z113 Encounter for screening for infections with a predominantly sexual mode of transmission: Secondary | ICD-10-CM | POA: Diagnosis not present

## 2022-03-19 DIAGNOSIS — M67432 Ganglion, left wrist: Secondary | ICD-10-CM | POA: Insufficient documentation

## 2022-03-19 NOTE — Discharge Instructions (Addendum)
Continue to monitor the ganglion for size If worsening and increased pain please follow-up with PCP for referral STD screening was completed and results pending Return or go to ED if you develop any new or worsening of symptoms

## 2022-03-19 NOTE — ED Provider Notes (Signed)
[place]   [id number] 03/19/22 Arrival Time: 1202   No chief complaint on file.    SUBJECTIVE: History from: patient.  Spencer Baker is a 32 y.o. male presented to the urgent care with a complaint STD screening.  Denies any aggravating factors.  He reports he is married and is sexually active with his wife.  Denies any other symptoms.  Denies chills, fever, nausea, vomiting, penile discharge or rash.    He is also complaining of ganglion cyst to left wrist for the past 2 months.  Denies any aggravating factors.  Reports pain is mild and activity.  Has not tried any OTC medication for relief.  Denies chills, fever, nausea, vomiting and diarrhea.    ROS: As per HPI.  All other pertinent ROS negative.     Past Medical History:  Diagnosis Date   Hypertension    History reviewed. No pertinent surgical history. No Known Allergies No current facility-administered medications on file prior to encounter.   Current Outpatient Medications on File Prior to Encounter  Medication Sig Dispense Refill   amLODipine (NORVASC) 2.5 MG tablet Take 1 tablet (2.5 mg total) by mouth daily. 30 tablet 0   ondansetron (ZOFRAN-ODT) 4 MG disintegrating tablet Take 1 tablet (4 mg total) by mouth every 8 (eight) hours as needed for nausea or vomiting. 20 tablet 0   Social History   Socioeconomic History   Marital status: Married    Spouse name: Not on file   Number of children: Not on file   Years of education: Not on file   Highest education level: Not on file  Occupational History   Not on file  Tobacco Use   Smoking status: Former    Packs/day: 0.50    Types: Cigarettes   Smokeless tobacco: Never  Vaping Use   Vaping Use: Never used  Substance and Sexual Activity   Alcohol use: No   Drug use: No   Sexual activity: Yes  Other Topics Concern   Not on file  Social History Narrative   Not on file   Social Determinants of Health   Financial Resource Strain: Not on file   Food Insecurity: Not on file  Transportation Needs: Not on file  Physical Activity: Not on file  Stress: Not on file  Social Connections: Not on file  Intimate Partner Violence: Not on file   History reviewed. No pertinent family history.  OBJECTIVE:  Vitals:   03/19/22 1315  BP: (!) 189/112  Pulse: 89  Resp: 18  Temp: 98.4 F (36.9 C)  TempSrc: Oral  SpO2: 98%    Physical Exam Vitals and nursing note reviewed.  Constitutional:      General: He is not in acute distress.    Appearance: Normal appearance. He is normal weight. He is not ill-appearing, toxic-appearing or diaphoretic.  Cardiovascular:     Rate and Rhythm: Normal rate and regular rhythm.     Pulses: Normal pulses.     Heart sounds: Normal heart sounds. No murmur heard.    No friction rub. No gallop.  Pulmonary:     Effort: Pulmonary effort is normal. No respiratory distress.     Breath sounds: Normal breath sounds. No stridor. No wheezing, rhonchi or rales.  Chest:     Chest wall: No tenderness.  Musculoskeletal:     Right wrist: Normal.     Left wrist: Normal.     Comments: Ganglion cyst present on left volar wrist  Neurological:  Mental Status: He is alert and oriented to person, place, and time.      LABS:  No results found for this or any previous visit (from the past 24 hour(s)).   ASSESSMENT & PLAN:  1. Ganglion cyst of volar aspect of left wrist   2. Screening examination for STD (sexually transmitted disease)     No orders of the defined types were placed in this encounter.   Discharge instructions  Continue to monitor the ganglia for size If worsening and increased pain please follow-up with PCP for referral STD screening was completed and results pending Return or go to ED if you develop any new or worsening of symptoms  Reviewed expectations re: course of current medical issues. Questions answered. Outlined signs and symptoms indicating need for more acute  intervention. Patient verbalized understanding. After Visit Summary given.          Emerson Monte, [place] 03/19/22 1353

## 2022-03-19 NOTE — ED Triage Notes (Signed)
Wants a repeat STD test.  Also c/o of knot that appeared on left wrist 2 months ago.

## 2022-03-21 LAB — CYTOLOGY, (ORAL, ANAL, URETHRAL) ANCILLARY ONLY
Chlamydia: NEGATIVE
Comment: NEGATIVE
Comment: NEGATIVE
Comment: NORMAL
Neisseria Gonorrhea: NEGATIVE
Trichomonas: POSITIVE — AB

## 2022-03-22 ENCOUNTER — Telehealth (HOSPITAL_COMMUNITY): Payer: Self-pay | Admitting: Emergency Medicine

## 2022-03-22 MED ORDER — METRONIDAZOLE 500 MG PO TABS: 2000.0000 mg | ORAL_TABLET | Freq: Once | ORAL | 0 refills | Status: AC

## 2022-04-26 ENCOUNTER — Ambulatory Visit
Admission: EM | Admit: 2022-04-26 | Discharge: 2022-04-26 | Disposition: A | Discharge status: Home / Self Care | Payer: 59 | Attending: Nurse Practitioner | Admitting: Nurse Practitioner

## 2022-04-26 DIAGNOSIS — B349 Viral infection, unspecified: Secondary | ICD-10-CM | POA: Diagnosis not present

## 2022-04-26 DIAGNOSIS — Z1152 Encounter for screening for COVID-19: Secondary | ICD-10-CM

## 2022-04-26 LAB — POCT INFLUENZA A/B
Influenza A, POC: NEGATIVE
Influenza B, POC: NEGATIVE

## 2022-04-26 MED ORDER — LOPERAMIDE HCL 2 MG PO CAPS: 2.0000 mg | ORAL_CAPSULE | Freq: Four times a day (QID) | ORAL | 0 refills | Status: AC | PRN

## 2022-04-26 NOTE — Discharge Instructions (Addendum)
The influenza test was negative.  A COVID test is pending.  You will be contacted if your COVID test is positive.  If the test is positive, you are a candidate to receive molnupiravir as antiviral therapy. Take medication as prescribed. Increase fluids and allow for plenty of rest. May take over-the-counter Tylenol as needed for pain, fever, or general discomfort. Recommend eating foods to add bulk to your stool.  For reference, I provided foods that you can eat to help lessen your diarrhea. If your abdominal pain worsens, and you develop worsening diarrhea with new fever, chills, or vomiting, please follow-up in the emergency department for further evaluation. Follow-up as needed.

## 2022-04-26 NOTE — ED Provider Notes (Signed)
[facility]    CSN: [id number] Arrival date & time: 04/26/22  [id number]      History   Chief Complaint Chief Complaint  Patient presents with   Diarrhea    HPI Spencer Baker is a 32 y.o. male.   The history is provided by the patient.   The patient presents for complaints of generalized abdominal pain and diarrhea that started over the past 24 hours.  Patient states "I just feel "blah".  Patient states that he has not had fever, but has felt warm, and just weak.  He denies chills, sore throat, cough, nasal congestion, runny nose, nausea, or vomiting.  Patient states he does have a headache that comes and goes, denies headache pain at this time.  He reports more than 7 episodes of diarrhea since his symptoms started with 2 episodes this morning.  Patient thinks that he may have also come into contact with someone who has COVID.  Past Medical History:  Diagnosis Date   Hypertension     Patient Active Problem List   Diagnosis Date Noted   Hidradenitis suppurativa    Sepsis due to cellulitis ([place]) 06/07/2019   OBESITY, NOS 04/27/2006   ATTENTION DEFICIT, W/HYPERACTIVITY 04/27/2006   HYPERTENSION, BENIGN SYSTEMIC 04/27/2006    History reviewed. No pertinent surgical history.     Home Medications    Prior to Admission medications   Medication Sig Start Date End Date Taking? Authorizing Provider  loperamide (IMODIUM) 2 MG capsule Take 1 capsule (2 mg total) by mouth 4 (four) times daily as needed for diarrhea or loose stools. 04/26/22  Yes Melayah Skorupski-Warren, Alda Lea, NP  amLODipine (NORVASC) 2.5 MG tablet Take 1 tablet (2.5 mg total) by mouth daily. 06/11/19 07/11/19  Manuella Ghazi, Pratik D, DO  ondansetron (ZOFRAN-ODT) 4 MG disintegrating tablet Take 1 tablet (4 mg total) by mouth every 8 (eight) hours as needed for nausea or vomiting. 01/25/22   Volney American, PA-C    Family History History reviewed. No pertinent family history.  Social History Social History    Tobacco Use   Smoking status: Former    Packs/day: 0.50    Types: Cigarettes   Smokeless tobacco: Never  Vaping Use   Vaping Use: Never used  Substance Use Topics   Alcohol use: No   Drug use: No     Allergies   Patient has no known allergies.   Review of Systems Review of Systems Per HPI  Physical Exam Triage Vital Signs ED Triage Vitals  Enc Vitals Group     BP 04/26/22 0919 (!) 158/103     Pulse Rate 04/26/22 0919 100     Resp 04/26/22 0919 18     Temp 04/26/22 0919 98.9 F (37.2 C)     Temp src --      SpO2 04/26/22 0919 95 %     Weight --      Height --      Head Circumference --      Peak Flow --      Pain Score 04/26/22 0918 7     Pain Loc --      Pain Edu? --      Excl. in [place]? --    No data found.  Updated Vital Signs BP (!) 158/103   Pulse 100   Temp 98.9 F (37.2 C)   Resp 18   SpO2 95%   Visual Acuity Right Eye Distance:   Left Eye Distance:   Bilateral Distance:  Right Eye Near:   Left Eye Near:    Bilateral Near:     Physical Exam Vitals and nursing note reviewed.  Constitutional:      General: He is not in acute distress.    Appearance: Normal appearance.  HENT:     Head: Normocephalic.     Right Ear: Tympanic membrane, ear canal and external ear normal.     Left Ear: Tympanic membrane, ear canal and external ear normal.     Nose: Nose normal.     Mouth/Throat:     Mouth: Mucous membranes are moist.     Pharynx: No posterior oropharyngeal erythema.  Eyes:     Extraocular Movements: Extraocular movements intact.     Conjunctiva/sclera: Conjunctivae normal.     Pupils: Pupils are equal, round, and reactive to light.  Cardiovascular:     Rate and Rhythm: Normal rate and regular rhythm.     Pulses: Normal pulses.     Heart sounds: Normal heart sounds.  Pulmonary:     Effort: Pulmonary effort is normal. No respiratory distress.     Breath sounds: Normal breath sounds. No stridor. No wheezing, rhonchi or rales.   Abdominal:     General: Bowel sounds are normal.     Palpations: Abdomen is soft.     Tenderness: There is abdominal tenderness in the right upper quadrant, right lower quadrant and periumbilical area.  Musculoskeletal:     Cervical back: Normal range of motion.  Lymphadenopathy:     Cervical: No cervical adenopathy.  Skin:    General: Skin is warm and dry.  Neurological:     General: No focal deficit present.     Mental Status: He is alert and oriented to person, place, and time.  Psychiatric:        Behavior: Behavior normal.      UC Treatments / Results  Labs (all labs ordered are listed, but only abnormal results are displayed) Labs Reviewed  SARS CORONAVIRUS 2 (TAT 6-24 HRS)  POCT INFLUENZA A/B    EKG   Radiology No results found.  Procedures Procedures (including critical care time)  Medications Ordered in UC Medications - No data to display  Initial Impression / Assessment and Plan / UC Course  I have reviewed the triage vital signs and the nursing notes.  Pertinent labs & imaging results that were available during my care of the patient were reviewed by me and considered in my medical decision making (see chart for details).  The patient is well-appearing, he is in no acute distress, vital signs are stable.  Influenza test was negative, COVID test is pending.  Patient is a candidate to receive molnupiravir if his COVID test is positive.  Suspect a viral gastroenteritis at this time versus viral illness.  Patient was prescribed Imodium 2 mg capsules for his continued diarrhea.  Supportive care recommendations were provided to the patient along with indications to follow-up in the emergency department would be necessary.  Patient is in agreement with this plan of care and verbalizes understanding.  All questions were answered.  Work note was provided.  Patient stable for discharge.   Final Clinical Impressions(s) / UC Diagnoses   Final diagnoses:  Encounter  for screening for COVID-19  Viral illness     Discharge Instructions      The influenza test was negative.  A COVID test is pending.  You will be contacted if your COVID test is positive.  If the test is positive, you  are a candidate to receive molnupiravir as antiviral therapy. Take medication as prescribed. Increase fluids and allow for plenty of rest. May take over-the-counter Tylenol as needed for pain, fever, or general discomfort. Recommend eating foods to add bulk to your stool.  For reference, I provided foods that you can eat to help lessen your diarrhea. If your abdominal pain worsens, and you develop worsening diarrhea with new fever, chills, or vomiting, please follow-up in the emergency department for further evaluation. Follow-up as needed.     ED Prescriptions     Medication Sig Dispense Auth. Provider   loperamide (IMODIUM) 2 MG capsule Take 1 capsule (2 mg total) by mouth 4 (four) times daily as needed for diarrhea or loose stools. 12 capsule Tiesha Marich-Warren, Alda Lea, NP      PDMP not reviewed this encounter.   Tish Men, NP 04/26/22 1024

## 2022-04-26 NOTE — ED Triage Notes (Signed)
Pt presents with feeling feverish, generalized abdominal pain, and diarrhea since yesterday. Pt came in contact with covid.

## 2022-04-27 LAB — SARS CORONAVIRUS 2 (TAT 6-24 HRS): SARS Coronavirus 2: NEGATIVE

## 2022-10-13 ENCOUNTER — Ambulatory Visit
Admission: EM | Admit: 2022-10-13 | Discharge: 2022-10-13 | Disposition: A | Discharge status: Home / Self Care | Payer: 59

## 2022-10-13 DIAGNOSIS — M79671 Pain in right foot: Secondary | ICD-10-CM

## 2022-10-13 NOTE — Discharge Instructions (Signed)
I suspect the area that is painful on your foot to be a plantar wart.  You may use over-the-counter wart medication, wart pads or the duct tape method or you keep duct tape on there for a certain period of time to kill the viral cells.  Follow-up with [facility] if not resolving

## 2022-10-13 NOTE — ED Provider Notes (Signed)
[facility]    CSN: [id number] Arrival date & time: 10/13/22  1623      History   Chief Complaint No chief complaint on file.   HPI Spencer Baker is a 32 y.o. male.   Patient presenting today with a localized area to the right plantar surface of the foot that has been painful for about a month, worsening pain over the past few days.  He is unsure if he may have gotten something stuck in his foot at some point.  Denies redness, drainage, decreased range of motion, fever, chills.  Has not tried anything over-the-counter for symptoms thus far.    Past Medical History:  Diagnosis Date   Hypertension     Patient Active Problem List   Diagnosis Date Noted   Hidradenitis suppurativa    Sepsis due to cellulitis ([facility]) 06/07/2019   OBESITY, NOS 04/27/2006   ATTENTION DEFICIT, W/HYPERACTIVITY 04/27/2006   HYPERTENSION, BENIGN SYSTEMIC 04/27/2006    History reviewed. No pertinent surgical history.     Home Medications    Prior to Admission medications   Medication Sig Start Date End Date Taking? Authorizing Provider  amLODipine (NORVASC) 2.5 MG tablet Take 1 tablet (2.5 mg total) by mouth daily. 06/11/19 07/11/19  Sherryll Burger, Pratik D, DO  loperamide (IMODIUM) 2 MG capsule Take 1 capsule (2 mg total) by mouth 4 (four) times daily as needed for diarrhea or loose stools. 04/26/22   Leath-Warren, Sadie Haber, NP  ondansetron (ZOFRAN-ODT) 4 MG disintegrating tablet Take 1 tablet (4 mg total) by mouth every 8 (eight) hours as needed for nausea or vomiting. 01/25/22   Particia Nearing, PA-C    Family History History reviewed. No pertinent family history.  Social History Social History   Tobacco Use   Smoking status: Former    Current packs/day: 0.50    Types: Cigarettes   Smokeless tobacco: Never  Vaping Use   Vaping status: Never Used  Substance Use Topics   Alcohol use: No   Drug use: No     Allergies   Patient has no known allergies.   Review of  Systems Review of Systems Per HPI  Physical Exam Triage Vital Signs ED Triage Vitals  Encounter Vitals Group     BP 10/13/22 1632 (!) 175/106     Systolic BP Percentile --      Diastolic BP Percentile --      Pulse Rate 10/13/22 1632 74     Resp 10/13/22 1632 18     Temp 10/13/22 1632 98.4 F (36.9 C)     Temp Source 10/13/22 1632 Oral     SpO2 10/13/22 1632 94 %     Weight --      Height --      Head Circumference --      Peak Flow --      Pain Score 10/13/22 1633 8     Pain Loc --      Pain Education --      Exclude from Growth Chart --    No data found.  Updated Vital Signs BP (!) 175/106 (BP Location: Right Arm)   Pulse 74   Temp 98.4 F (36.9 C) (Oral)   Resp 18   SpO2 94%   Visual Acuity Right Eye Distance:   Left Eye Distance:   Bilateral Distance:    Right Eye Near:   Left Eye Near:    Bilateral Near:     Physical Exam Vitals and nursing note  reviewed.  Constitutional:      Appearance: Normal appearance.  HENT:     Head: Atraumatic.     Mouth/Throat:     Mouth: Mucous membranes are moist.     Pharynx: Oropharynx is clear.  Eyes:     Extraocular Movements: Extraocular movements intact.     Conjunctiva/sclera: Conjunctivae normal.  Cardiovascular:     Rate and Rhythm: Normal rate and regular rhythm.  Pulmonary:     Effort: Pulmonary effort is normal.     Breath sounds: Normal breath sounds.  Musculoskeletal:        General: Normal range of motion.     Cervical back: Normal range of motion and neck supple.  Skin:    General: Skin is warm and dry.     Comments: Flesh-colored plantar wart type lesion to the right plantar surface of the foot, tender to palpation  Neurological:     General: No focal deficit present.     Mental Status: He is oriented to person, place, and time.     Comments: Right lower extremity neurovascularly intact  Psychiatric:        Mood and Affect: Mood normal.        Thought Content: Thought content normal.         Judgment: Judgment normal.      UC Treatments / Results  Labs (all labs ordered are listed, but only abnormal results are displayed) Labs Reviewed - No data to display  EKG   Radiology No results found.  Procedures Procedures (including critical care time)  Medications Ordered in UC Medications - No data to display  Initial Impression / Assessment and Plan / UC Course  I have reviewed the triage vital signs and the nursing notes.  Pertinent labs & imaging results that were available during my care of the patient were reviewed by me and considered in my medical decision making (see chart for details).     Suspect plantar wart, low suspicion for foreign body retention as there is no erythema, pustule or obviously visualized foreign body and the lesion is recessed from the surface of the skin.  Treat with Compound W, wart pads and podiatry follow-up if not resolving  Final Clinical Impressions(s) / UC Diagnoses   Final diagnoses:  Right foot pain     Discharge Instructions      I suspect the area that is painful on your foot to be a plantar wart.  You may use over-the-counter wart medication, wart pads or the duct tape method or you keep duct tape on there for a certain period of time to kill the viral cells.  Follow-up with [facility] if not resolving   ED Prescriptions   None    PDMP not reviewed this encounter.   Particia Nearing, [place] 10/13/22 1650

## 2022-10-13 NOTE — ED Triage Notes (Signed)
Pt reports he feels like he stepped on glass x 1 month ago. States the pain has recently increased.

## 2022-11-01 ENCOUNTER — Other Ambulatory Visit: Payer: Self-pay

## 2022-11-01 ENCOUNTER — Ambulatory Visit
Admission: EM | Admit: 2022-11-01 | Discharge: 2022-11-01 | Disposition: A | Discharge status: Home / Self Care | Payer: 59 | Attending: Family Medicine | Admitting: Family Medicine

## 2022-11-01 ENCOUNTER — Encounter: Payer: Self-pay | Admitting: Emergency Medicine

## 2022-11-01 DIAGNOSIS — Z1152 Encounter for screening for COVID-19: Secondary | ICD-10-CM | POA: Insufficient documentation

## 2022-11-01 DIAGNOSIS — R059 Cough, unspecified: Secondary | ICD-10-CM | POA: Diagnosis not present

## 2022-11-01 DIAGNOSIS — J069 Acute upper respiratory infection, unspecified: Secondary | ICD-10-CM | POA: Insufficient documentation

## 2022-11-01 DIAGNOSIS — B9789 Other viral agents as the cause of diseases classified elsewhere: Secondary | ICD-10-CM | POA: Diagnosis not present

## 2022-11-01 MED ORDER — PROMETHAZINE-DM 6.25-15 MG/5ML PO SYRP: 5.0000 mL | ORAL_SOLUTION | Freq: Four times a day (QID) | ORAL | 0 refills | Status: AC | PRN

## 2022-11-01 NOTE — ED Triage Notes (Addendum)
Pt reports nasal congestion, sore throat, cough since Friday. Pt inquiring about covid test.

## 2022-11-01 NOTE — ED Provider Notes (Signed)
[facility]    CSN: [id number] Arrival date & time: 11/01/22  1515      History   Chief Complaint Chief Complaint  Patient presents with   Nasal Congestion    HPI Spencer Baker is a 32 y.o. male.   Patient presenting today with 4-day history of nasal congestion, sore throat, cough, fatigue.  Denies fever, chills, body aches, chest pain, shortness of breath, abdominal pain, nausea vomiting or diarrhea.  Taking allergy medications and DayQuil with minimal relief.  No known sick contacts recently.  Does have a history of seasonal allergies.    Past Medical History:  Diagnosis Date   Hypertension     Patient Active Problem List   Diagnosis Date Noted   Hidradenitis suppurativa    Sepsis due to cellulitis ([facility]) 06/07/2019   OBESITY, NOS 04/27/2006   ATTENTION DEFICIT, W/HYPERACTIVITY 04/27/2006   HYPERTENSION, BENIGN SYSTEMIC 04/27/2006    History reviewed. No pertinent surgical history.     Home Medications    Prior to Admission medications   Medication Sig Start Date End Date Taking? Authorizing Provider  promethazine-dextromethorphan (PROMETHAZINE-DM) 6.25-15 MG/5ML syrup Take 5 mLs by mouth 4 (four) times daily as needed. 11/01/22  Yes Particia Nearing, PA-C  amLODipine (NORVASC) 2.5 MG tablet Take 1 tablet (2.5 mg total) by mouth daily. 06/11/19 07/11/19  Sherryll Burger, Pratik D, DO  loperamide (IMODIUM) 2 MG capsule Take 1 capsule (2 mg total) by mouth 4 (four) times daily as needed for diarrhea or loose stools. 04/26/22   Leath-Warren, Sadie Haber, NP  ondansetron (ZOFRAN-ODT) 4 MG disintegrating tablet Take 1 tablet (4 mg total) by mouth every 8 (eight) hours as needed for nausea or vomiting. 01/25/22   Particia Nearing, PA-C    Family History History reviewed. No pertinent family history.  Social History Social History   Tobacco Use   Smoking status: Former    Types: Cigarettes   Smokeless tobacco: Never  Vaping Use   Vaping status:  Never Used  Substance Use Topics   Alcohol use: No   Drug use: No     Allergies   Patient has no known allergies.   Review of Systems Review of Systems Per HPI  Physical Exam Triage Vital Signs ED Triage Vitals [11/01/22 1544]  Encounter Vitals Group     BP (!) 165/126     Systolic BP Percentile      Diastolic BP Percentile      Pulse Rate 78     Resp 20     Temp 98.3 F (36.8 C)     Temp Source Oral     SpO2 98 %     Weight      Height      Head Circumference      Peak Flow      Pain Score 0     Pain Loc      Pain Education      Exclude from Growth Chart    No data found.  Updated Vital Signs BP (!) 165/126 (BP Location: Right Arm) Comment: pt reports hx of similar in the past. is not currently px medication. trying to get in with PCP. NAD noted.  Pulse 78   Temp 98.3 F (36.8 C) (Oral)   Resp 20   SpO2 98%   Visual Acuity Right Eye Distance:   Left Eye Distance:   Bilateral Distance:    Right Eye Near:   Left Eye Near:    Bilateral Near:  Physical Exam Vitals and nursing note reviewed.  Constitutional:      Appearance: He is well-developed.  HENT:     Head: Atraumatic.     Right Ear: External ear normal.     Left Ear: External ear normal.     Nose: Rhinorrhea present.     Mouth/Throat:     Pharynx: Posterior oropharyngeal erythema present. No oropharyngeal exudate.  Eyes:     Conjunctiva/sclera: Conjunctivae normal.     Pupils: Pupils are equal, round, and reactive to light.  Cardiovascular:     Rate and Rhythm: Normal rate and regular rhythm.  Pulmonary:     Effort: Pulmonary effort is normal. No respiratory distress.     Breath sounds: No wheezing or rales.  Musculoskeletal:        General: Normal range of motion.     Cervical back: Normal range of motion and neck supple.  Lymphadenopathy:     Cervical: No cervical adenopathy.  Skin:    General: Skin is warm and dry.  Neurological:     Mental Status: He is alert and oriented to  person, place, and time.  Psychiatric:        Behavior: Behavior normal.      UC Treatments / Results  Labs (all labs ordered are listed, but only abnormal results are displayed) Labs Reviewed  SARS CORONAVIRUS 2 (TAT 6-24 HRS)    EKG   Radiology No results found.  Procedures Procedures (including critical care time)  Medications Ordered in UC Medications - No data to display  Initial Impression / Assessment and Plan / UC Course  I have reviewed the triage vital signs and the nursing notes.  Pertinent labs & imaging results that were available during my care of the patient were reviewed by me and considered in my medical decision making (see chart for details).     Vital signs and exam overall reassuring, hypertensive in triage otherwise within normal limits.  Suspect viral upper respiratory infection, COVID testing pending, good candidate for Paxlovid if positive.  Treat with Phenergan DM, supportive over-the-counter medications and home care.  Work note given.  Return for worsening symptoms.  Final Clinical Impressions(s) / UC Diagnoses   Final diagnoses:  Viral URI with cough     Discharge Instructions      You should have your COVID test back tomorrow and someone will call if positive.  In the meantime, continue your allergy medication, DayQuil and NyQuil type medications and I have sent over a good cough syrup.    ED Prescriptions     Medication Sig Dispense Auth. Provider   promethazine-dextromethorphan (PROMETHAZINE-DM) 6.25-15 MG/5ML syrup Take 5 mLs by mouth 4 (four) times daily as needed. 100 mL Particia Nearing, [place]      PDMP not reviewed this encounter.   Particia Nearing, [place] 11/01/22 1724

## 2022-11-01 NOTE — Discharge Instructions (Signed)
You should have your COVID test back tomorrow and someone will call if positive.  In the meantime, continue your allergy medication, DayQuil and NyQuil type medications and I have sent over a good cough syrup.

## 2022-11-02 LAB — SARS CORONAVIRUS 2 (TAT 6-24 HRS): SARS Coronavirus 2: NEGATIVE

## 2023-11-07 ENCOUNTER — Ambulatory Visit
Admission: EM | Admit: 2023-11-07 | Discharge: 2023-11-07 | Disposition: A | Discharge status: Home / Self Care | Payer: Self-pay

## 2023-11-07 DIAGNOSIS — M10071 Idiopathic gout, right ankle and foot: Secondary | ICD-10-CM

## 2023-11-07 MED ORDER — IBUPROFEN 800 MG PO TABS
800.0000 mg | ORAL_TABLET | Freq: Three times a day (TID) | ORAL | 0 refills | Status: AC
Start: 2023-11-07 — End: ?

## 2023-11-07 MED ORDER — PREDNISONE 20 MG PO TABS
40.0000 mg | ORAL_TABLET | Freq: Every day | ORAL | 0 refills | Status: AC
Start: 2023-11-07 — End: 2023-11-12

## 2023-11-07 NOTE — ED Provider Notes (Signed)
 [facility]   Note:  This document was prepared using Dragon voice recognition software and may include unintentional dictation errors.  MRN: [id number] DOB: 1990-09-04  Subjective:   Spencer Baker is a 33 y.o. male presenting for right foot pain and swelling since this morning.  Patient reports past history of gout arthritis.  Patient states that he ate shrimp 2 days ago which he usually does not do due to his history of gout.  Patient denies any alcohol intake, has maintained normal diet other than eating shrimp.  Patient reports increased pain with ambulation and with moving of his toes.  Has not taken any over-the-counter medication to treat symptoms.  Patient reports that last time he had a gout flare he was prescribed ibuprofen  which seemed to help.  No current facility-administered medications for this encounter.  Current Outpatient Medications:    ibuprofen  (ADVIL ) 800 MG tablet, Take 1 tablet (800 mg total) by mouth 3 (three) times daily., Disp: 30 tablet, Rfl: 0   predniSONE  (DELTASONE ) 20 MG tablet, Take 2 tablets (40 mg total) by mouth daily for 5 days., Disp: 10 tablet, Rfl: 0   amLODipine  (NORVASC ) 2.5 MG tablet, Take 1 tablet (2.5 mg total) by mouth daily., Disp: 30 tablet, Rfl: 0   loperamide  (IMODIUM ) 2 MG capsule, Take 1 capsule (2 mg total) by mouth 4 (four) times daily as needed for diarrhea or loose stools., Disp: 12 capsule, Rfl: 0   ondansetron  (ZOFRAN -ODT) 4 MG disintegrating tablet, Take 1 tablet (4 mg total) by mouth every 8 (eight) hours as needed for nausea or vomiting., Disp: 20 tablet, Rfl: 0   promethazine -dextromethorphan (PROMETHAZINE -DM) 6.25-15 MG/5ML syrup, Take 5 mLs by mouth 4 (four) times daily as needed., Disp: 100 mL, Rfl: 0   No Known Allergies  Past Medical History:  Diagnosis Date   Hypertension      History reviewed. No pertinent surgical history.  History reviewed. No pertinent family history.  Social History   Tobacco  Use   Smoking status: Former    Types: Cigarettes   Smokeless tobacco: Never  Vaping Use   Vaping status: Never Used  Substance Use Topics   Alcohol use: No   Drug use: No    ROS Refer to HPI for ROS details.  Objective:   Vitals: BP (!) 188/103 (BP Location: Right Arm)   Pulse 82   Temp 98.2 F (36.8 C) (Oral)   Resp 18   SpO2 98%   Physical Exam Vitals and nursing note reviewed.  Constitutional:      General: He is not in acute distress.    Appearance: Normal appearance. He is not ill-appearing or toxic-appearing.  HENT:     Head: Normocephalic.  Cardiovascular:     Rate and Rhythm: Normal rate.  Pulmonary:     Effort: Pulmonary effort is normal. No respiratory distress.  Musculoskeletal:     Right foot: Decreased range of motion. Normal capillary refill. Swelling, tenderness and bony tenderness present. No deformity or crepitus. Normal pulse.  Skin:    General: Skin is warm and dry.     Capillary Refill: Capillary refill takes less than 2 seconds.  Neurological:     General: No focal deficit present.     Mental Status: He is alert and oriented to person, place, and time.  Psychiatric:        Mood and Affect: Mood normal.        Behavior: Behavior normal.     Procedures  No  results found for this or any previous visit (from the past 24 hours).  No results found.   Assessment and Plan :     Discharge Instructions       1. Acute idiopathic gout of right foot (Primary) - predniSONE  (DELTASONE ) 20 MG tablet; Take 2 tablets (40 mg total) by mouth daily for 5 days.  Dispense: 10 tablet; Refill: 0 - ibuprofen  (ADVIL ) 800 MG tablet; Take 1 tablet (800 mg total) by mouth 3 (three) times daily.  Dispense: 30 tablet; Refill: 0 -Continue to monitor symptoms for any change in severity if there is any escalation of current symptoms or development of new symptoms follow-up in ER for further evaluation and management.      Yazmyn Valbuena B Debbra Digiulio   Huan Pollok,  [place], [place] 11/07/23 1123

## 2023-11-07 NOTE — Discharge Instructions (Addendum)
  1. Acute idiopathic gout of right foot (Primary) - predniSONE  (DELTASONE ) 20 MG tablet; Take 2 tablets (40 mg total) by mouth daily for 5 days.  Dispense: 10 tablet; Refill: 0 - ibuprofen  (ADVIL ) 800 MG tablet; Take 1 tablet (800 mg total) by mouth 3 (three) times daily.  Dispense: 30 tablet; Refill: 0 -Continue to monitor symptoms for any change in severity if there is any escalation of current symptoms or development of new symptoms follow-up in ER for further evaluation and management.

## 2023-11-07 NOTE — ED Triage Notes (Signed)
 Pt reports he is having pain, swelling, and redness in right foot since this morning.   Recently had shellfish x 2 days    Pt does not have a PCP

## 2024-08-05 ENCOUNTER — Ambulatory Visit: Payer: Self-pay
